# Patient Record
Sex: Male | Born: 1935 | Race: White | Hispanic: No | Marital: Married | Smoking: Former smoker
Health system: Southern US, Community
[De-identification: ages and names within clinical notes are randomized; demographics above are authoritative.]

## PROBLEM LIST (undated history)

## (undated) DIAGNOSIS — I739 Peripheral vascular disease, unspecified: Secondary | ICD-10-CM

## (undated) DIAGNOSIS — M1990 Unspecified osteoarthritis, unspecified site: Secondary | ICD-10-CM

## (undated) DIAGNOSIS — K5730 Diverticulosis of large intestine without perforation or abscess without bleeding: Secondary | ICD-10-CM

## (undated) DIAGNOSIS — R42 Dizziness and giddiness: Secondary | ICD-10-CM

## (undated) DIAGNOSIS — I10 Essential (primary) hypertension: Secondary | ICD-10-CM

## (undated) HISTORY — PX: APPENDECTOMY: SHX54

## (undated) HISTORY — DX: Dizziness and giddiness: R42

## (undated) HISTORY — DX: Essential (primary) hypertension: I10

## (undated) HISTORY — PX: OTHER SURGICAL HISTORY: SHX169

## (undated) HISTORY — DX: Peripheral vascular disease, unspecified: I73.9

## (undated) HISTORY — PX: TONSILLECTOMY: SUR1361

## (undated) HISTORY — DX: Diverticulosis of large intestine without perforation or abscess without bleeding: K57.30

---

## 1997-12-13 ENCOUNTER — Emergency Department (HOSPITAL_COMMUNITY): Admission: EM | Admit: 1997-12-13 | Discharge: 1997-12-13 | Payer: Self-pay | Admitting: Emergency Medicine

## 1998-03-02 ENCOUNTER — Inpatient Hospital Stay: Admission: RE | Admit: 1998-03-02 | Discharge: 1998-03-03 | Payer: Self-pay | Admitting: Thoracic Surgery

## 1998-03-04 ENCOUNTER — Emergency Department (HOSPITAL_COMMUNITY): Admission: EM | Admit: 1998-03-04 | Discharge: 1998-03-04 | Payer: Self-pay | Admitting: Emergency Medicine

## 2000-09-22 ENCOUNTER — Encounter: Admission: RE | Admit: 2000-09-22 | Discharge: 2000-09-22 | Payer: Self-pay | Admitting: Orthopedic Surgery

## 2000-09-22 ENCOUNTER — Encounter: Payer: Self-pay | Admitting: Orthopedic Surgery

## 2000-09-24 ENCOUNTER — Ambulatory Visit (HOSPITAL_BASED_OUTPATIENT_CLINIC_OR_DEPARTMENT_OTHER): Admission: RE | Admit: 2000-09-24 | Discharge: 2000-09-25 | Payer: Self-pay | Admitting: Orthopedic Surgery

## 2000-10-21 ENCOUNTER — Encounter: Admission: RE | Admit: 2000-10-21 | Discharge: 2000-12-08 | Payer: Self-pay | Admitting: Orthopedic Surgery

## 2000-12-10 ENCOUNTER — Encounter: Payer: Self-pay | Admitting: *Deleted

## 2000-12-10 ENCOUNTER — Ambulatory Visit (HOSPITAL_COMMUNITY): Admission: RE | Admit: 2000-12-10 | Discharge: 2000-12-10 | Payer: Self-pay | Admitting: *Deleted

## 2003-08-20 HISTORY — PX: OTHER SURGICAL HISTORY: SHX169

## 2005-02-27 ENCOUNTER — Ambulatory Visit (HOSPITAL_COMMUNITY): Admission: RE | Admit: 2005-02-27 | Discharge: 2005-03-06 | Payer: Self-pay | Admitting: Surgery

## 2005-02-27 ENCOUNTER — Encounter: Admission: RE | Admit: 2005-02-27 | Discharge: 2005-02-27 | Payer: Self-pay | Admitting: Surgery

## 2005-09-04 ENCOUNTER — Ambulatory Visit: Payer: Self-pay | Admitting: Gastroenterology

## 2005-09-18 ENCOUNTER — Ambulatory Visit: Payer: Self-pay | Admitting: Gastroenterology

## 2005-09-18 ENCOUNTER — Encounter (INDEPENDENT_AMBULATORY_CARE_PROVIDER_SITE_OTHER): Payer: Self-pay | Admitting: *Deleted

## 2008-08-24 ENCOUNTER — Ambulatory Visit: Payer: Self-pay | Admitting: Cardiology

## 2008-09-09 ENCOUNTER — Ambulatory Visit: Payer: Self-pay

## 2008-09-28 ENCOUNTER — Ambulatory Visit: Payer: Self-pay | Admitting: Cardiology

## 2009-08-17 ENCOUNTER — Encounter (INDEPENDENT_AMBULATORY_CARE_PROVIDER_SITE_OTHER): Payer: Self-pay | Admitting: *Deleted

## 2010-06-22 ENCOUNTER — Encounter: Payer: Self-pay | Admitting: Cardiovascular Disease

## 2010-06-22 ENCOUNTER — Ambulatory Visit: Payer: Self-pay

## 2010-06-22 DIAGNOSIS — I7025 Atherosclerosis of native arteries of other extremities with ulceration: Secondary | ICD-10-CM | POA: Insufficient documentation

## 2010-06-22 DIAGNOSIS — I739 Peripheral vascular disease, unspecified: Secondary | ICD-10-CM

## 2010-06-22 DIAGNOSIS — L98499 Non-pressure chronic ulcer of skin of other sites with unspecified severity: Secondary | ICD-10-CM

## 2010-07-02 ENCOUNTER — Telehealth (INDEPENDENT_AMBULATORY_CARE_PROVIDER_SITE_OTHER): Payer: Self-pay | Admitting: *Deleted

## 2010-07-03 DIAGNOSIS — R42 Dizziness and giddiness: Secondary | ICD-10-CM

## 2010-07-03 DIAGNOSIS — J45909 Unspecified asthma, uncomplicated: Secondary | ICD-10-CM | POA: Insufficient documentation

## 2010-07-03 DIAGNOSIS — E7800 Pure hypercholesterolemia, unspecified: Secondary | ICD-10-CM

## 2010-07-03 DIAGNOSIS — Z8719 Personal history of other diseases of the digestive system: Secondary | ICD-10-CM

## 2010-07-03 DIAGNOSIS — I10 Essential (primary) hypertension: Secondary | ICD-10-CM | POA: Insufficient documentation

## 2010-07-03 DIAGNOSIS — J449 Chronic obstructive pulmonary disease, unspecified: Secondary | ICD-10-CM | POA: Insufficient documentation

## 2010-07-03 DIAGNOSIS — R0789 Other chest pain: Secondary | ICD-10-CM | POA: Insufficient documentation

## 2010-07-04 ENCOUNTER — Ambulatory Visit: Payer: Self-pay | Admitting: Cardiovascular Disease

## 2010-07-04 ENCOUNTER — Encounter: Payer: Self-pay | Admitting: Cardiovascular Disease

## 2010-07-04 DIAGNOSIS — I739 Peripheral vascular disease, unspecified: Secondary | ICD-10-CM

## 2010-07-24 ENCOUNTER — Ambulatory Visit: Payer: Self-pay | Admitting: Cardiovascular Disease

## 2010-07-24 LAB — CONVERTED CEMR LAB
BUN: 15 mg/dL (ref 6–23)
Basophils Absolute: 0 10*3/uL (ref 0.0–0.1)
CO2: 30 meq/L (ref 19–32)
Eosinophils Absolute: 0.2 10*3/uL (ref 0.0–0.7)
GFR calc non Af Amer: 66.39 mL/min (ref >60.00)
Glucose, Bld: 101 mg/dL — ABNORMAL HIGH (ref 70–99)
HCT: 48.4 % (ref 39.0–52.0)
Lymphs Abs: 2.1 10*3/uL (ref 0.7–4.0)
MCHC: 34.8 g/dL (ref 30.0–36.0)
MCV: 93.8 fL (ref 78.0–100.0)
Monocytes Absolute: 0.4 10*3/uL (ref 0.1–1.0)
Monocytes Relative: 7.6 % (ref 3.0–12.0)
Neutro Abs: 2.9 10*3/uL (ref 1.4–7.7)
Platelets: 143 10*3/uL — ABNORMAL LOW (ref 150.0–400.0)
Potassium: 4.4 meq/L (ref 3.5–5.1)
Prothrombin Time: 10.3 s (ref 9.7–11.8)
RDW: 13.3 % (ref 11.5–14.6)

## 2010-07-27 ENCOUNTER — Ambulatory Visit (HOSPITAL_COMMUNITY)
Admission: RE | Admit: 2010-07-27 | Discharge: 2010-07-27 | Payer: Self-pay | Source: Home / Self Care | Attending: Cardiovascular Disease | Admitting: Cardiovascular Disease

## 2010-08-07 ENCOUNTER — Ambulatory Visit: Payer: Self-pay | Admitting: Cardiovascular Disease

## 2010-08-30 ENCOUNTER — Telehealth (INDEPENDENT_AMBULATORY_CARE_PROVIDER_SITE_OTHER): Payer: Self-pay

## 2010-09-03 ENCOUNTER — Encounter: Payer: Self-pay | Admitting: Cardiovascular Disease

## 2010-09-05 ENCOUNTER — Inpatient Hospital Stay (HOSPITAL_COMMUNITY)
Admission: AD | Admit: 2010-09-05 | Discharge: 2010-09-11 | Payer: Self-pay | Source: Home / Self Care | Attending: Cardiovascular Disease | Admitting: Cardiovascular Disease

## 2010-09-05 ENCOUNTER — Telehealth: Payer: Self-pay | Admitting: Cardiovascular Disease

## 2010-09-07 NOTE — H&P (Signed)
[name] [name], [name] [name]             ACCOUNT NO.:  [id number]  MEDICAL RECORD NO.:  [id number]          PATIENT TYPE:  INP  LOCATION:  2014                         FACILITY:  [facility]  PHYSICIAN:  Noralyn Pick. Eden Emms, MD, FACCDATE OF BIRTH:  11-17-1923  DATE OF ADMISSION:  09/05/2010 DATE OF DISCHARGE:                             HISTORY & PHYSICAL   PRIMARY CARE PROVIDER:  Gaspar Garbe, MD  PRIMARY CARDIOLOGIST:  [name], MD  PODIATRIST:  Eugenio Hoes. Petrinitz, DPM  HISTORY OF PRESENT ILLNESS:  This is an 77 year old gentleman with history of PVD, prior carotid endarterectomy, and hypertension who was followed by Dr. Wynelle Cleveland for an ulcer on his right great that is slow to heal.  The patient sees Dr. Clifton James for history of peripheral vascular disease.  He had Doppler studies in November 2011 with suggestion of bilateral lower extremity disease.  His ABIs are mildly reduced in both legs.  There is at least 50% stenosis in the right mid SFA.  The patient has no pain at rest but occasional calf pain with ambulation.  The patient's ulcer on the right great toe began early November.  After initial evaluation, a distal arteriogram of the lower extremity runoff was scheduled.  This showed severe PAD in the right lower extremity with severe disease in the right SFA in the mid and distal segment with one vessel runoff to the right foot via the PTA. However, PTA was severely diseased in the mid segment with total obstruction and an extensive collateral network with reconstitution distally.  Vascular surgery reviewed the case with Dr. Clifton James and there was no good percutaneous options for revascularization. Therefore, conservative management of the ulceration was decided upon. Initially the ulceration was slowly healing despite the poor arterial flow.  The patient was started on Augmentin and Cipro orally as an outpatient.  His infection appears to be spreading as his toe has  gotten red and edematous.  The patient called Dr. Gibson Ramp office and things getting worse and risk of tissue loss possibly requiring amputation. The patient was admitted for IV antibiotics.  The patient does complain of mild tenderness around the right toe.  He denies any fevers/chills, nausea/vomiting, or chest pain or shortness of breath.  The patient is currently hemodynamically stable.  PAST MEDICAL HISTORY: 1. Hypertension. 2. Dizziness. 3. Diverticulosis. 4. Asthma. 5. Peripheral artery disease. 6. Status post right carotid endarterectomy in 2005. 7. Status post repair of Achilles tendon rupture. 8. Status post appendectomy. 9. Status post tonsillectomy.  SOCIAL HISTORY:  The patient is married and lives with his wife.  He is refired from the banking business.  He has a remote history of tobacco abuse but quit approximately 30 years ago.  He denies any alcohol or illicit drug use.  FAMILY HISTORY:  Noncontributory for early coronary artery disease.  His mother and father are both deceased, age 32 and 68 respectively from unknown causes.  He has a brother who is deceased from liver disease.  ALLERGIES:  No known drug allergies.  HOME MEDICATIONS: 1. Celebrex 100 mg 1 capsule daily. 2. Spiriva Handihaler 18 mcg 1 puff once daily. 3.  Alprazolam 0.5 mg tablets 1 tablet at bedtime. 4. ProAir HFA 108 mcg inhaled as needed for shortness of breath. 5. Fluticasone nasally as needed. 6. Silver sulfadiazine 1% cream apply to affected area 2 times a day. 7. Ciprofloxacin 500 mg 1 tablet 2 times a day. 8. Augmentin 875 mg 1 tablet 2 times a day. 9. Multivitamin daily. 10.PreserVision capsules, 2 capsules daily. 11.Aspirin 81 mg daily.  REVIEW OF SYSTEMS:  All pertinent positives and negatives as stated in HPI.  Other systems have been reviewed and are negative.  PHYSICAL EXAMINATION:  VITAL SIGNS:  Temperature 97.1, pulse 91, respiration 18, blood pressure 158/77, O2  saturation 97% on room air. GENERAL:  This is a well-developed, well-nourished elderly gentleman in no acute distress. HEENT:  Normal. NECK:  Supple without bruit or JVD. HEART:  Regular rate and rhythm without murmur, gallops, or rubs. Pulses are diminished bilaterally. LUNGS:  Clear to auscultation bilaterally without wheezes, rales, or rhonchi. ABDOMEN:  Soft, nontender, positive bowel sounds x4. EXTREMITIES:  No clubbing or cyanosis.  Right great toe with ulceration over the tip.  There is surrounding erythema.  Area is warm to touch. Erythema does extend almost to the heel area. MUSCULOSKELETAL:  No joint deformities or effusions. NEURO:  Alert and oriented x3, cranial nerves II through XII grossly intact.  Labs pending.  ASSESSMENT/PLAN:  This is an 77 year old gentleman with peripheral vascular disease with lower extremity runoff with partial occlusion with collaterals that are not suitable for bypass.  The patient is now with  increased cellulitis around the right toe and forefoot.  The patient has been admitted to telemetry and an ID consult has been obtained for IV antibiotics initiation.  We will leave consideration for bone scan to ID as well.  Dr. Clifton James will evaluate the patient in the morning and further recommendations for addition of Pletal will be decided.  The patient will be continued on aspirin.  VTE prophylaxis will be with Lovenox.     Leonette Monarch, PA-C   ______________________________ Noralyn Pick Eden Emms, MD, [facility]    NB/MEDQ  D:  09/05/2010  T:  09/06/2010  Job:  [id number]  cc:   Gaspar Garbe, M.D. [name], MD Eugenio Hoes. Petrinitz, D.P.M.  Electronically Signed by Alen Blew P.A. on 09/06/2010 06:35:45 PM Electronically Signed by Charlton Haws MD [facility] on 09/07/2010 06:09:06 PM

## 2010-09-10 LAB — BASIC METABOLIC PANEL
BUN: 11 mg/dL (ref 6–23)
BUN: 13 mg/dL (ref 6–23)
CO2: 28 mEq/L (ref 19–32)
CO2: 28 mEq/L (ref 19–32)
Calcium: 9 mg/dL (ref 8.4–10.5)
Calcium: 8.4 mg/dL (ref 8.4–10.5)
Chloride: 97 mEq/L (ref 96–112)
Chloride: 98 mEq/L (ref 96–112)
Creatinine, Ser: 1.22 mg/dL (ref 0.4–1.5)
Creatinine, Ser: 1.19 mg/dL (ref 0.4–1.5)
Creatinine, Ser: 1.34 mg/dL (ref 0.4–1.5)
GFR calc Af Amer: >60 mL/min (ref >60)
GFR calc Af Amer: >60 mL/min (ref >60)
GFR calc non Af Amer: 57 mL/min — ABNORMAL LOW (ref >60)
GFR calc non Af Amer: 58 mL/min — ABNORMAL LOW (ref >60)
GFR calc non Af Amer: 51 mL/min — ABNORMAL LOW (ref >60)
Glucose, Bld: 153 mg/dL — ABNORMAL HIGH (ref 70–99)
Glucose, Bld: 101 mg/dL — ABNORMAL HIGH (ref 70–99)
Potassium: 4.3 mEq/L (ref 3.5–5.1)
Potassium: 4.2 mEq/L (ref 3.5–5.1)
Sodium: 135 mEq/L (ref 135–145)
Sodium: 134 mEq/L — ABNORMAL LOW (ref 135–145)

## 2010-09-10 LAB — WOUND CULTURE
Culture: NO GROWTH
Gram Stain: NONE SEEN

## 2010-09-10 LAB — CBC
HCT: 48 % (ref 39.0–52.0)
HCT: 43.8 % (ref 39.0–52.0)
Hemoglobin: 16.1 g/dL (ref 13.0–17.0)
Hemoglobin: 14.7 g/dL (ref 13.0–17.0)
MCH: 30.7 pg (ref 26.0–34.0)
MCH: 30.4 pg (ref 26.0–34.0)
MCHC: 33.5 g/dL (ref 30.0–36.0)
MCHC: 33.6 g/dL (ref 30.0–36.0)
MCV: 91.6 fL (ref 78.0–100.0)
Platelets: 191 10*3/uL (ref 150–400)
Platelets: 182 10*3/uL (ref 150–400)
RBC: 5.24 MIL/uL (ref 4.22–5.81)
RBC: 5.15 MIL/uL (ref 4.22–5.81)
RDW: 12.9 % (ref 11.5–15.5)
RDW: 12.5 % (ref 11.5–15.5)
RDW: 12.9 % (ref 11.5–15.5)
WBC: 5.7 10*3/uL (ref 4.0–10.5)
WBC: 5 10*3/uL (ref 4.0–10.5)

## 2010-09-11 DIAGNOSIS — L03119 Cellulitis of unspecified part of limb: Secondary | ICD-10-CM

## 2010-09-11 DIAGNOSIS — L02419 Cutaneous abscess of limb, unspecified: Secondary | ICD-10-CM | POA: Insufficient documentation

## 2010-09-11 LAB — PROTIME-INR
INR: 1.02 (ref 0.00–1.49)
Prothrombin Time: 13.6 seconds (ref 11.6–15.2)

## 2010-09-11 LAB — CBC
Hemoglobin: 14.3 g/dL (ref 13.0–17.0)
MCH: 31 pg (ref 26.0–34.0)
MCHC: 34.4 g/dL (ref 30.0–36.0)
Platelets: 179 10*3/uL (ref 150–400)
RDW: 12.8 % (ref 11.5–15.5)

## 2010-09-11 LAB — BASIC METABOLIC PANEL
Calcium: 8.2 mg/dL — ABNORMAL LOW (ref 8.4–10.5)
Calcium: 8.2 mg/dL — ABNORMAL LOW (ref 8.4–10.5)
Creatinine, Ser: 1.42 mg/dL (ref 0.4–1.5)
GFR calc Af Amer: 57 mL/min — ABNORMAL LOW (ref >60)
GFR calc Af Amer: >60 mL/min (ref >60)
GFR calc non Af Amer: 48 mL/min — ABNORMAL LOW (ref >60)
GFR calc non Af Amer: 57 mL/min — ABNORMAL LOW (ref >60)
Glucose, Bld: 93 mg/dL (ref 70–99)
Potassium: 3.9 mEq/L (ref 3.5–5.1)
Sodium: 135 mEq/L (ref 135–145)
Sodium: 134 mEq/L — ABNORMAL LOW (ref 135–145)

## 2010-09-11 LAB — VANCOMYCIN, TROUGH: Vancomycin Tr: 11.7 ug/mL (ref 10.0–20.0)

## 2010-09-12 LAB — CBC
HCT: 40.8 % (ref 39.0–52.0)
MCHC: 34.1 g/dL (ref 30.0–36.0)
Platelets: 191 10*3/uL (ref 150–400)
RDW: 13 % (ref 11.5–15.5)
WBC: 6.6 10*3/uL (ref 4.0–10.5)

## 2010-09-12 LAB — BASIC METABOLIC PANEL
BUN: 20 mg/dL (ref 6–23)
CO2: 24 mEq/L (ref 19–32)
Chloride: 105 mEq/L (ref 96–112)
Creatinine, Ser: 1.39 mg/dL (ref 0.4–1.5)
GFR calc Af Amer: 59 mL/min — ABNORMAL LOW (ref >60)
Potassium: 3.8 mEq/L (ref 3.5–5.1)

## 2010-09-16 NOTE — Consult Note (Addendum)
[name] [name], [name] [name]             ACCOUNT NO.:  [id number]  MEDICAL RECORD NO.:  [id number]           PATIENT TYPE:  LOCATION:                                 FACILITY:  PHYSICIAN:  Fransisco Hertz, MD       DATE OF BIRTH:  23-Jan-1924  DATE OF CONSULTATION:  09/06/2010 DATE OF DISCHARGE:                                CONSULTATION   REQUESTING PHYSICIAN:  [name] Carrow, MD  REASON FOR CONSULTATION:  Right foot gangrene.  HISTORY OF PRESENT ILLNESS:  This is an 77 year old gentleman who presents with chief complaint of swelling and pain in his right foot. In reviewing the chart, in November 2011, the patient started having problems with his right great toe, apparently after having seen a podiatrist and diagnosed him with an ulcer on his great toe.  He was then seen by his interventional cardiologist who had concerns that he had significant amount of peripheral arterial disease, and the patient underwent a diagnostic angiogram in December 2011 which was significant for multilevel disease and extensive calcification, demonstrating multilevel disease and extensive calcification extending from the aorta all the way down to his tibials.  He at that point did not have in-line flow to his tibials.  He was managed at this point conservatively per the patient's preferences.  The patient recently started noticing increased swelling in his right foot with increased pain and was subsequently admitted for inpatient IV antibiotics.  At this point, the patient notes with medication, including IV antibiotics, that his pain in his right great toe has completely resolved.  The patient denies any fevers or chills at home and noticed minimal drainage from this great toe.  Other than this one episode of development of right great toe ulceration, he denies any other wounds in his feet.  PAST MEDICAL HISTORY: 1. Hypertension. 2. Dizziness. 3. Diverticulosis. 4. Asthma. 5. Peripheral arterial  disease. 6. History of right internal carotid artery stenosis. 7. History of Achilles tendon rupture. 8. History of chronic obstructive pulmonary disease. 9. Hyperlipidemia.  PAST SURGICAL HISTORY: 1. Right carotid endarterectomy, done by Dr. Edwyna Shell in 2005. 2. He also underwent a repair of his right Achilles tendon rupture. 3. He has had an appendectomy and tonsillectomy and adenoidectomy done     previously.  SOCIAL HISTORY:  He is retired.  Previous history of tobacco smoking but quit over 25 years ago.  Denies any current alcohol or illicit drug use.  FAMILY HISTORY:  Mother and father died of old age of unknown causes.  MEDICATIONS:  He is on Celebrex, Spiriva, Xanax, ProAir inhaler, Flonase, Silvadene, Cipro, Augmenting, PreserVision capsules, and aspirin.  He has no known drug allergies.  REVIEW OF SYSTEMS:  As listed above, otherwise, noted to be negative on questioning.  PHYSICAL EXAMINATION:  VITAL SIGNS:  He had a temperature of 97.8 with a blood pressure of 163/89, heart rate of 80, respirations were 18.  He sats at 97% on room air. GENERAL:  Well developed, well nourished, in no apparent distress, alert and oriented x3. HEENT:  Head:  Normocephalic, atraumatic.  ENT:  Hearing was grossly intact.  Oropharynx  without any obvious erythema or exudate.  Nares without any drainage or erythema.  Eyes:  Pupils were equal, round, reactive to light.  Extraocular movements were intact. NECK:  He had no nuchal rigidity.  He had a supple neck.  There was no JVD. PULMONARY:  He has symmetric expansion.  Good air movement.  There were no rales, rhonchi, or wheezing. CARDIAC:  Regular rate and rhythm.  Normal S1 and S2.  No murmurs, rubs, thrills, or gallops. VASCULAR:  He has palpable radials, brachials, and carotids bilaterally. There is a healed right neck incision consistent with a carotid endarterectomy.  There are no bruits in either neck.  I did not appreciate aortic  pulse due to some mild obesity.  He has palpable femoral pulses; however, I do not appreciate any popliteal or pedal pulses. GASTROINTESTINAL:  He had a soft abdomen, nontender, nondistended.  No guarding, no rebound, no hepatosplenomegaly.  There were no obvious masses. MUSCULOSKELETAL:  He had 5/5 strength in upper extremities and the left leg.  I was not able to fully test his strength in his right foot due to the foot ulcer; however, he was able to extend and flex his knee with 5/5 strength, and was able to fully plantar flex and dorsal flex. EXTREMITIES:  On examination of the right foot, the foot has 1+ edema. The toe now demonstrates dry gangrene on the plantar surface of the toe. There is no frank drainage.  At this point, the toe is significantly swollen, some mild erythema extending up the dorsum of this foot, does not extend past the level of the ankle, some dependent rubor which improved with elevating at this foot.  On the left foot, there are no signs of gangrene or ulceration. NEUROLOGIC:  His cranial nerves II-XII were intact.  His motor was as listed above.  His sensation was intact including the right foot where he had intact light touch to evaluation. PSYCH:  His judgment was intact.  His mood and affect were appropriate for his clinical situation. SKIN:  He had some dysplastic lesions on his face and arms; however, I did not note any rashes elsewhere in his body.  The extremities were as listed above. LYMPHATIC:  No cervical, axillary, inguinal lymphadenopathy were noted.  LABORATORY STUDIES:  Initial wound cultures did not grow any organisms and no white cells were seen.  He had a chemistry, sodium 138, potassium 4.2, chloride of 98, bicarb 27, glucose 101, BUN was 13, creatinine is 1.2.  CBC; white count was 5.0 with H and H of 15.8 and 45.5, platelet count was 182.  I also reviewed Dr. Gibson Ramp previous aortogram and right leg runoff, and I agree with his  interpretation of the films, there is extensive amount of atherosclerotic disease throughout this patient with heavy calcification in some segments of the arteries.  Just looking at the inflow on this side, there is some atherosclerotic disease in the iliac system.  Distally, it appears that the dominant runoff in the foot is a posterior tibial which reconstitutes via collaterals.  This artery distally looks to be about 2 mm to 2.5 mm.  There is also anterior tibial which reconstitutes on the dorsum of the foot and extends down into the foot as the DP, but it seems to attenuate, likely due to decreased flow.  MEDICAL DECISION MAKING:  This is an 77 year old gentleman with what sounds like significant peripheral arterial disease that was managed initially medically that now has developed infectious complications of  his previous toe ulcer.  After reviewing the films, I am quite concerned with the quality of the inflow and the size of the target vessels as the dorsalis pedis vessel I think is less than 2 mm and then the posterior tibial target would be anywhere from 2-2.5 mm in diameter, but unfortunately, I do not have this image been archived at this point, hence it is not amenable to digital caliper measurements.  I wouldrecommend this patient proceeding with bilateral lower extremity vein mapping to see if there is a possible conduit, also preop from a medical optimization viewpoint and proceed with risk stratification.  Given the presence of atherosclerotic disease in his carotids and his legs, there is almost certainly some degree of coronary artery disease.  Additionally, the patient on his previous chest x-rays has demonstrated findings consistent with COPD.  Unfortunately, I suspect in this case the risks versus long-term patency in this case may weight against aggressive intervention as he minimally is going to need like a common femoral artery to likely posterior tibial bypass.   The patencies of such are very poor with prosthetic grafts, hence the importance of the vein mapping.  However, the other concern with this is the quality inflow. There is a significant amount of atherosclerotic disease within the iliac and the aorta itself.  This ultimately will translate into compromised long-term patencies for any type of distal bypass.  I also discussed with the patient possible need for amputation, especially if there is any evidence of osteomyelitis.  He at this point has emphatically said he does not want any form of amputation.  At this point, I think that he would undergo all the diagnostic testing and bring him back to the peripheral lab and redo a right leg angiogram specifically looking at potential targets for bypass and the quality of the inflow to better assess the burden of calcium disease in his arteries.  Based on this data along with his overall status in terms of his heart and other medical issues, I think we would be able to get a better feel for whether or not the risks outweigh the benefits in this case in this patient.  I discussed this strategy with the patient, at this point he agrees to proceed forward with such.  Thank you for giving [place] the opportunity to participate in this patient's care, and we will arrange for this angiogram for Monday.     Fransisco Hertz, MD BLC/MEDQ  D:  09/06/2010  T:  09/07/2010  Job:  [id number]  Electronically Signed by Leonides Sake MD on 09/16/2010 02:45:33 PM

## 2010-09-16 NOTE — Op Note (Addendum)
NAMEWYNNE, JURY             ACCOUNT NO.:  [id number]  MEDICAL RECORD NO.:  [id number]          PATIENT TYPE:  INP  LOCATION:  2014                         FACILITY:  [facility]  PHYSICIAN:  Fransisco Hertz, MD       DATE OF BIRTH:  03-06-1924  DATE OF PROCEDURE:  09/10/2010 DATE OF DISCHARGE:                              OPERATIVE REPORT   PROCEDURE: 1. Left common femoral artery cannulation with ultrasound guidance. 2. Third order arterial selection. 3. Right leg angiogram.  PREOPERATIVE DIAGNOSES:  Right foot ischemia and cellulitis.  POSTOPERATIVE DIAGNOSES:  Right foot ischemia and cellulitis.  SURGEON:  Arlys John L. Imogene Burn, MD  ANESTHESIA:  Conscious sedation.  CONTRAST:  60 mL.  FINDINGS: 1. Patent right common femoral artery. 2. Diseased right superficial femoral artery with multiple stenoses,     at least two greater 50% with extensive amount of     calcification throughout this artery. 3. Patent right profunda artery. 4. Patent right popliteal artery with above-the-knee stenosis, greater     than 50%. 5. The right anterior tibial artery occludes shortly after takedown. 6. The right tibioperoneal trunk is patent. 7. The right peroneal artery occludes about halfway down the lower     leg. 8. The right posterior tibial artery occludes distal to the ankle     joint. 9. Right foot fills via collaterals from reconstituted peroneal and     posterior tibial arteries. 10.The distal reconstituted posterior tibial is only about 2 mm at the     level below the ankle. 11.Stenoses and calcifications are evident throughout the plantar     arch. 12.There is evidence of pedal filling despite the extent of disease     noted above.  INDICATIONS:  This is an 77 year old gentleman with known peripheral arterial disease.  Recently, he developed some possible cellulitis in his right foot and possible ischemic changes to his right great toe. Vascular Surgery consultation was obtained  to try to see if he would be a bypass candidate.  I reviewed, Dr. Gibson Ramp previous angiogram done on this leg and is evident this patient has extensive atherosclerotic disease throughout his arterial system.  I could not fully appreciate the extent of calcification and the caliber of the distal targets in this patient on this previous angiogram, so I could not make a definite decision whether or not he would be a candidate for a bypass procedure. Consequently, I discussed with the patient the need to repeat the right leg angiogram.  He agreed to proceed for he is aware of the risk of this procedure, which includes access complication, bleeding, infection, possible embolization, possible dissection, and need for emergent surgical intervention.  He agreed to proceed forward with the operation with an understanding of these risks.  DESCRIPTION OF OPERATION:  After full informed written consent was obtained from the patient, he was brought back to the angio suite, and  placed supine upon the angio table.  He was connectec to monitoring equipment and was given conscious sedation, the amounts of which are documented in his chart.  I turned my attention to his left groin.  Under ultrasound  guidance, I identified the left common femoral artery and cannulated it with an 18-gauge needle and passed the Bentson wire up into the aorta. The needle was then exchanged for 5-French sheath.  A dilator was removed and then the Omni flush catheter was loaded over the wire.  Using the wire and Omni Flush catheter, I was able to select out the right common iliac artery, and I was able to advance the wire into the external iliac artery; however, it would not pass the catheter.  The catheter was exchanged for a 4-French end-hole catheter, which was loaded over the wire and advanced down into the external iliac artery.  I then was able to navigate into the superficial femoral artery and then lodged the  end- hole catheter down into the superficial femoral artery.  This was then connected to a power injector circuit.  After completing a de-airing and declotting maneuver, an automated right leg runoff was then completed, the findings of which are noted as above.  Then, I positioned the patient's right foot in the lateral position and completed a single power injector angiogram to evaluate the right foot.  Based on these findings, I do not think this patient is a candidate for a surgical bypass as the distal pedal artery arch already demonstrates disease, and any bypass to this 2 mm of vessel would already be of limited patency given the small size of the target vessel, but also the addition of outflow disease is unlikely to have any reasonable patency and subsequently does not justify the cardiac risk in this patient.  He does, however, have filling of his pedal arch despite the extent of his disease and if this patient is not able to fully perfuse and heal his right foot by itself, he would be a candidate in the future for orbital atherectomy and possible angioplasty of the SFA above-knee popliteal and possible peroneal artery. COMPLICATIONS:  None.  CONDITION:  Stable.     Fransisco Hertz, MD     BLC/MEDQ  D:  09/10/2010  T:  09/11/2010  Job:  [id number]  Electronically Signed by Leonides Sake MD on 09/16/2010 02:49:25 PM

## 2010-09-17 NOTE — Discharge Summary (Signed)
NAMEJOVIAN, [name] [name]             ACCOUNT NO.:  [id number]  MEDICAL RECORD NO.:  [id number]          PATIENT TYPE:  INP  LOCATION:  2014                         FACILITY:  [facility]  PHYSICIAN:  [name], MDDATE OF BIRTH:  April 14, 1924  DATE OF ADMISSION:  09/05/2010 DATE OF DISCHARGE:  09/11/2010                              DISCHARGE SUMMARY   PRIMARY CARDIOLOGIST:  [name], MD  PRIMARY CARE PHYSICIAN:  Gaspar Garbe, MD  DISCHARGE DIAGNOSIS:  Lower extremity peripheral vascular disease.  SECONDARY DIAGNOSES: 1. Hypertension. 2. Chronic dizziness. 3. Diverticulosis. 4. Asthma. 5. Cellulitis with ongoing antibiotic therapy. 6. Status post right carotid endarterectomy in 2005. 7. Status post Achilles tendon rupture. 8. Status post appendectomy. 9. Status post tonsillectomy. 10.Tobacco abuse.  ALLERGIES:  No known drug allergies.  PROCEDURES:  Right lower extremity angiogram revealing multiple greater than 50% stenoses involving the right superficial femoral artery, right popliteal artery, right anterior tibial, right peroneal, right posterior tibial.  The right foot fills via collaterals from reconstituted peroneal and posterior tibial.  Anatomy not suitable for lower extremity bypass.  HISTORY OF PRESENT ILLNESS:  An 77 year old male with history of peripheral vascular disease and right lower extremity cellulitis, poorly healing ulceration with concern for ischemia.  In the outpatient setting, the patient had been treated with Augmentin and Cipro, however, infection appeared to be worsening.  The patient called into our office on September 05, 2010, and decision was made to admit the patient for IV antibiotics.  The patient presented to the ED and was subsequently admitted.  HOSPITAL COURSE:  Throughout admission, the patient remained afebrile with normal white blood cell count.  Infectious Disease was consulted and intravenous vancomycin was  initiated.  Outpatient x-rays of his right foot were reviewed and there was question of fracture of the distal right first phalanx.  There was not obvious abscess and was not clear if the patient had osteomyelitis.  It was not felt the patient required biopsy or I and D at this time as those could potentially worsen the patient's condition given known peripheral vascular disease and arterial insufficiency.  The patient's foot improved some with IV vancomycin, continued antibiotics, and conservative management recommended along with vascular surgical evaluation.  The patient was seen by Dr. Jerre Simon and decision was made that the patient will require right lower extremity runoff to evaluate anatomy and consider bypass if possible.  Peripheral angiography was performed on September 10, 2010, revealing significant diffuse lower extremity disease as outlined above.  It was felt that the patient's anatomy was not compatible with bypass with orbital atherectomy of the superficial femoral artery, popliteal, and possibly peroneal plus/minus PTA may improve flow to the foot, but carried significant risk of embolization and dissection.  For the time being, continued medical therapy with antibiotics as warranted and the patient is healing over the course the next 2 weeks upon follow up with Dr. Johny Drilling, then atherectomy will be reconsidered.  In the meantime, the patient will be discharged home today in good condition on Keflex therapy at the recommendation of Infectious Disease.  DISCHARGE LABS:  Hemoglobin 13.9, hematocrit 40.8, WBC 6.6,  platelets 191.  Sodium 138, potassium 3.8, chloride 105, CO2 24, BUN 20, creatinine 1.39, glucose 92, calcium 8.5.  Wound culture showed no growth.  DISPOSITION:  The patient will discharged home today in good condition.  FOLLOWUP PLANS AND APPOINTMENTS:  The patient will follow up with Dr. Imogene Burn in approximately 2 weeks, appointment is pending.  Follow up  Dr. Clifton James on October 29, 2010, at 9 a.m.  Follow up with Dr. Wylene Simmer as scheduled.  DISCHARGE MEDICATIONS: 1. Norvasc 10 mg daily. 2. Pletal 50 mg b.i.d. 3. Keflex 500 mg q.i.d. x10 days. 4. Aspirin 81 mg daily. 5. Celebrex 200 mg daily. 6. Flonase 1 spray daily p.r.n. 7. Multivitamin 1 tab daily. 8. ProAir inhaler 1-2 puffs q.4-6 hours p.r.n. 9. PreserVision over the counter 1 tablet daily. 10.Spiriva 18 mcg daily p.r.n. 11.Xanax 0.5 mg 1/2 a tablet q.h.s. p.r.n.  OUTSTANDING LAB STUDIES:  None.  DURATION OF DISCHARGE ENCOUNTER:  60 minutes including physician time.     [name], ANP   ______________________________ [name], MD    CB/MEDQ  D:  09/11/2010  T:  09/12/2010  Job:  [id number]  cc:   Fransisco Hertz, MD Gaspar Garbe, M.D. Cliffton Asters, M.D.  Electronically Signed by [name] ANP on 09/17/2010 12:28:30 PM Electronically Signed by [name] MD on 09/17/2010 02:44:36 PM

## 2010-09-18 ENCOUNTER — Ambulatory Visit: Admit: 2010-09-18 | Payer: Self-pay | Admitting: Cardiovascular Disease

## 2010-09-20 NOTE — Letter (Signed)
Summary: Peripheral Vascular  [facility], Main Office  [address]   [place], [place] [address]   Phone: [phone]  Fax: [phone]     07/24/2010 MRN: [id number]  [facility] [address] [place], [place]  [address]  Dear Mr. KASPAR,   You are scheduled for Peripheral Vascular Angiogram on  07/27/10            with Dr.McAlhany.  Please arrive at the [facility] at 8:30     a.m. on the day of your procedure.  1. DIET     __x__ Nothing to eat or drink after midnight except your medications with a sip of water.  2. MAKE SURE YOU TAKE YOUR ASPIRIN.      __x__ YOU MAY TAKE ALL of your remaining medications with a small amount of water.   3. Plan for one night stay - bring personal belongings (i.e. toothpaste, toothbrush, etc.)  4. Bring a current list of your medications and current insurance cards.  5. Must have a responsible person to drive you home.   6. Someone must be with yu for the first 24 hours after you arrive home.  7. Please wear clothes that are easy to get on and off and wear slip-on shoes.  *Special note: Every effort is made to have your procedure done on time.  Occasionally there are emergencies that present themselves at the hospital that may cause delays.  Please be patient if a delay does occur.  If you have any questions after you get home, please call the office at the number listed above.   Whitney Maeola Sarah RN

## 2010-09-20 NOTE — Assessment & Plan Note (Signed)
Summary: eval for rt great toe ulcer dx 440.23/lower arterial doppler ...   Visit Type:  new PV pt visit Primary Provider:  [name]  CC:  pt here for ulcer on right great toe.....sob at times...pt states he has leg cramps ever since he started on the Amoxicillian for the toe.....[name]  History of Present Illness: 77 yo male with history of PVD, prior carotid endarterectomy, HTN and former tobacco abuse here today for PV evaluation.  He has an ulcer on his right great toe which has been folllowed by Dr. Leonia Reeves for the last two weeks. Doppler studies done on 06/22/10 with suggestion of bilateral lower ext disease. His ABI are mildly reduced in both legs. He describes occasional calf pain with ambulation that resolves with rest. No rest pain in the legs. He has no other complaints. He tells me that he feels well.   Current Medications (verified): 1)  Celebrex 200 Mg Caps (Celecoxib) .[name].. 1 Cap Once Daily 2)  Spiriva Handihaler 18 Mcg Caps (Tiotropium Bromide Monohydrate) .[name].. 1 Puff Once Daily 3)  Alprazolam 0.5 Mg Tabs (Alprazolam) .[name].. 1 Tab At Bedtime 4)  Proair Hfa 108 (90 Base) Mcg/act Aers (Albuterol Sulfate) .... Use As Directed As Needed 5)  Fluticasone Propionate 50 Mcg/act Susp (Fluticasone Propionate) .... As Needed 6)  Silver Sulfadiazine 1 % Crea (Silver Sulfadiazine) .... Apply To Affected Area Two Times A Day 7)  Ciprofloxacin-Ciproflox Hcl 500 Mg Xr24h-Tab (Ciprofloxacin-Ciproflox Hcl) .[name].. 1 Tab Two Times A Day 8)  Amoxicillin-Pot Clavulanate 875-125 Mg Tabs (Amoxicillin-Pot Clavulanate) .[name].. 1 Tab Two Times A Day 9)  Multivitamins   Tabs (Multiple Vitamin) .[name].. 1 Tab Once Daily 10)  Preservision Areds  Caps (Multiple Vitamins-Minerals) .... 2 Caps Once Daily 11)  Aspirin 81 Mg Tbec (Aspirin) .... Take One Tablet By Mouth Daily  Allergies (verified): No Known Drug Allergies  Past History:  Past Medical History: HYPERTENSION (ICD-401.9) DIZZINESS  (ICD-780.4) DIVERTICULOSIS, COLON, HX OF (ICD-V12.79) ASTHMA (ICD-493.90) PVD  Past Surgical History: Right carotid endarterectomy 2005 Exploration debridement, repair of Achilles tendon rupture, right. SURGEON:  Loreta Ave, M.D. Appendectomy Tonsillectomy  Family History: Reviewed history from 07/03/2010 and no changes required.  There is no strong family history of coronary disease   at a young age.  Mother-deceased, unknown reason, age 26.  Father-deceased, unknwon reason age 95 1 brother recently died from liver issues No sisters  Social History: Reviewed history from 07/03/2010 and no changes required.  The patient is married.   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   He did smoke in the past, but quit 30 years ago.  No alcohol No ilicit drug use  Review of Systems  The patient denies fatigue, malaise, fever, weight gain/loss, vision loss, decreased hearing, hoarseness, chest pain, palpitations, shortness of breath, prolonged cough, wheezing, sleep apnea, coughing up blood, abdominal pain, blood in stool, nausea, vomiting, diarrhea, heartburn, incontinence, blood in urine, muscle weakness, joint pain, leg swelling, rash, skin lesions, headache, fainting, dizziness, depression, anxiety, enlarged lymph nodes, easy bruising or bleeding, and environmental allergies.         Right great toe ulcer, occasional pain in calf muscles with walking.   Vital Signs:  Patient profile:   77 year old male Height:      71 inches Weight:      189 pounds BMI:     26.46 Pulse rate:  97 / minute Pulse rhythm:   irregular BP sitting:   152 / 70  (left arm) Cuff size:   regular  Vitals Entered By: Danielle Rankin, CMA (July 04, 2010 11:18 AM)  Physical Exam  General:  General: Well developed, well nourished, NAD HEENT: OP clear, mucus membranes moist SKIN: warm, dry Neuro: No focal  deficits Musculoskeletal: Muscle strength 5/5 all ext Psychiatric: Mood and affect normal Neck: No JVD, no carotid bruits, no thyromegaly, no lymphadenopathy. Lungs:Clear bilaterally, no wheezes, rhonci, crackles CV: RRR no murmurs, gallops rubs Abdomen: soft, NT, ND, BS present Extremities: No edema, pulses trace bilateral DP/PT. Healing ulceration over tip of right great toe. Minimal surrounding erythema.     EKG  Procedure date:  07/04/2010  Findings:      NSR, non-specific ST changes.   Arterial Doppler  Procedure date:  06/22/2010  Findings:      Heavily calcified bilateral SFAs.  Focal >50% proximal to mid SFA stenosis. Right ABI 0.74. TBI 0.51. Focal 0-49% stenosis  mid to distal left SFA. Left ABI 0.79, TBI 0.56. Bilateral TBI are in range for adequate  tissue healing.   Impression & Recommendations:  Problem # 1:  PVD (ICD-443.9) Mr. Bray has an ulcer on his right great toe with mild reduction of the bilateral ABI suggestive of at least mild to moderate obstructive lower extremity arterial disease. At this time, his toe wound appears to healing. I have offered him a distal aortogram with bilateral lower extremity runoff to better define the extent of his disease. He is not interested in pursuing an invasive workup at this time. I will see him back in the office in 2-3 weeks and have this discussion again at that time. If his toe wound is well healed, we may be able to pursue a conservative strategy of care. If the toe wound is not healing, we can consider the angiogram to look for percutaneous options.   Other Orders: EKG w/ Interpretation (93000)  Patient Instructions: 1)  Your physician recommends that you schedule a follow-up appointment in: 2-3 weeks. 2)  Your physician recommends that you continue on your current medications as directed. Please refer to the Current Medication list given to you today.

## 2010-09-20 NOTE — Assessment & Plan Note (Signed)
Summary: f77m/wpa   Visit Type:  Follow-up Primary Provider:  [name]  CC:  Right great toe ulcer.  History of Present Illness: 77 yo male with history of PVD, prior carotid endarterectomy, HTN and former tobacco abuse here today for PV follow up  He has an ulcer on his right great toe which has been folllowed by Dr. Leonia Reeves for the last month. Doppler studies done on 06/22/10 with suggestion of bilateral lower ext disease. His ABI are mildly reduced in both legs. There is at least a 50% stenosis in the right mid SFA. He describes occasional calf pain with ambulation that resolves with rest. No rest pain in the legs. He has no other complaints. I saw him as a new patient two weeks ago and we decided to wait and see if the ulcer healed as his ABI are only mildy reduced on the right. He is here today and the ulcer on the right great toe is not healing well. He has no other complaints. He is due to see Dr. Ann Lions tomorrow.   Problems Prior to Update: 1)  Pvd  (ICD-443.9) 2)  Chest Discomfort  (ICD-786.59) 3)  Hypercholesterolemia  (ICD-272.0) 4)  Hypertension  (ICD-401.9) 5)  Dizziness  (ICD-780.4) 6)  Diverticulosis, Colon, Hx of  (ICD-V12.79) 7)  Asthma  (ICD-493.90) 8)  COPD  (ICD-496) 9)  [place] Native Art Extremities W/ulceration  (ICD-440.23)  Current Medications (verified): 1)  Celebrex 200 Mg Caps (Celecoxib) .[name].. 1 Cap Once Daily 2)  Spiriva Handihaler 18 Mcg Caps (Tiotropium Bromide Monohydrate) .[name].. 1 Puff Once Daily 3)  Alprazolam 0.5 Mg Tabs (Alprazolam) .[name].. 1 Tab At Bedtime 4)  Proair Hfa 108 (90 Base) Mcg/act Aers (Albuterol Sulfate) .... Use As Directed As Needed 5)  Fluticasone Propionate 50 Mcg/act Susp (Fluticasone Propionate) .... As Needed 6)  Silver Sulfadiazine 1 % Crea (Silver Sulfadiazine) .... Apply To Affected Area Two Times A Day 7)  Multivitamins   Tabs (Multiple Vitamin) .[name].. 1 Tab Once Daily 8)  Preservision Areds  Caps (Multiple  Vitamins-Minerals) .... 2 Caps Once Daily 9)  Aspirin 81 Mg Tbec (Aspirin) .... Take One Tablet By Mouth Daily  Allergies: No Known Drug Allergies  Past History:  Past Medical History: Reviewed history from 07/04/2010 and no changes required. HYPERTENSION (ICD-401.9) DIZZINESS (ICD-780.4) DIVERTICULOSIS, COLON, HX OF (ICD-V12.79) ASTHMA (ICD-493.90) PVD  Past Surgical History: Right carotid endarterectomy 2005 Exploration debridement, repair of Achilles tendon rupture, right. SURGEON:  Loreta Ave, M.D. Appendectomy Tonsillectomy Perforated large intestines  Family History: Reviewed history from 07/04/2010 and no changes required.  There is no strong family history of coronary disease   at a young age.  Mother-deceased, unknown reason, age 61.  Father-deceased, unknwon reason age 77 1 brother recently died from liver issues No sisters  Social History: Reviewed history from 07/04/2010 and no changes required.  The patient is married.   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   He did smoke in the past, but quit 30 years ago.  No alcohol No ilicit drug use  Review of Systems  The patient denies fatigue, malaise, fever, weight gain/loss, vision loss, decreased hearing, hoarseness, chest pain, palpitations, shortness of breath, prolonged cough, wheezing, sleep apnea, coughing up blood, abdominal pain, blood in stool, nausea, vomiting, diarrhea, heartburn, incontinence, blood in urine, muscle weakness, joint pain, leg swelling, rash, skin lesions, headache, fainting, dizziness, depression, anxiety, enlarged  lymph nodes, easy bruising or bleeding, and environmental allergies.         right great toe ulceration  Vital Signs:  Patient profile:   77 year old male Height:      71 inches Weight:      187 pounds Pulse rate:   88 / minute Resp:     14 per minute BP sitting:   164 / 70  (left  arm)  Vitals Entered By: Danielle Rankin, CMA (July 24, 2010 2:34 PM)   Physical Exam  General:  General: Well developed, well nourished, NAD HEENT: OP clear, mucus membranes moist SKIN: warm, dry Neuro: No focal deficits Musculoskeletal: Muscle strength 5/5 all ext Psychiatric: Mood and affect normal Neck: No JVD, no carotid bruits, no thyromegaly, no lymphadenopathy. Lungs:Clear bilaterally, no wheezes, rhonci, crackles CV: RRR no murmurs, gallops rubs Abdomen: soft, NT, ND, BS present Extremities: No edema, pulses trace bilateral DP/PT. Healing ulceration over tip of right great toe. Minimal surrounding erythema.    Prior Report Reviewed for Arterial Doppler:  Findings: 06/22/2010 Heavily calcified bilateral SFAs.  Focal >50% proximal to mid SFA stenosis. Right ABI 0.74. TBI 0.51. Focal 0-49% stenosis  mid to distal left SFA. Left ABI 0.79, TBI 0.56. Bilateral TBI are in range for adequate  tissue healing.   Comments:    Impression & Recommendations:  Problem # 1:  PVD (ICD-443.9) Mr. Sarate has at least moderate peripheral arterial disease. He has an ulcer on his right great toe that is slowly healing but is sitll ulcerated. We have discussed angiography to define his PAD. He is in agreement today to proceed. Will plan for Friday December 9th at [facility]. Risks and benefits reviewed with the patient. Will check BMET, CBC and coags today. I am hopeful that he will have disease amenable to percutaneous intervention.   Other Orders: PV Procedure (PV Procedure) TLB-BMP (Basic Metabolic Panel-BMET) (80048-METABOL) TLB-CBC Platelet - w/Differential (85025-CBCD) TLB-PT (Protime) (85610-PTP)  Patient Instructions: 1)  Your physician recommends that you schedule a follow-up appointment in:  2)  Your physician recommends that you continue on your current medications as directed. Please refer to the Current Medication list given to you today. 3)  Your physician  has requested that you have a peripheral vascular angiogram. You are scheduled for 07/27/10 @ 10:30am. Please be there at 8:30am.  This exam is performed at the hospital. During this exam IV contrast is used to look at arterial blood flow.  Please review the information sheet given for details.

## 2010-09-20 NOTE — Progress Notes (Signed)
Summary: Schedule recall colon  Phone Note Outgoing Call Call back at [facility] Phone [phone]   Call placed by: Darcey Nora RN, CGRN,  August 30, 2010 4:11 PM Call placed to: Patient Summary of Call: I spoke with the patient's wife about scheduling  a recall colonoscopy.  The patient's wife does not think the patient is interested at all in scheduling a screening colon.  She is given the number to our office I have asked her to please call us or have him call us if he is interested in persuing.  He is asked to schedule an office visit to discuss if interested. Initial call taken by: Darcey Nora RN, CGRN,  August 30, 2010 4:12 PM  Follow-up for Phone Call        Please cancel colonoscopy recalls. Follow-up by: Meryl Dare MD FACG,  August 30, 2010 8:35 PM  Additional Follow-up for Phone Call Additional follow up Details #1::        recall canceled in [facility] Additional Follow-up by: Darcey Nora RN, CGRN,  August 31, 2010 7:28 AM

## 2010-09-20 NOTE — Assessment & Plan Note (Signed)
Summary: f17m/wpa   Visit Type:  Follow-up Primary [name] [name]:  [name] [name]  CC:  None.  History of Present Illness: 77 yo male with history of PVD, prior carotid endarterectomy, HTN and former tobacco abuse here today for PV follow up  He has an ulcer on his right great toe which has been folllowed by Dr. Leonia Reeves for the last month. Doppler studies done on 06/22/10 with suggestion of bilateral lower ext disease. His ABI are mildly reduced in both legs. There is at least a 50% stenosis in the right mid SFA. He describes occasional calf pain with ambulation that resolves with rest. No rest pain in the legs. He has no other complaints. I saw him as a new patient two weeks ago and we decided to wait and see if the ulcer healed as his ABI are only mildy reduced on the right. I arranged a distal aortogram with lower extremity runoff. This was performed on 12/0/11. He was found to have severe PAD in the right lower extremity with severe disease in the right SFA in the mid and distal segment with 1 vessel runoff to the right foot via the PTA  however the PTA was severely diseased in the mid segment with total obstruction and an extensive collateral network with reconstitution distally. I discussed his case with vascular surgery. There were no good percutaneous options for revascularization. We decided to manage conservatively and see if the ulceration on the great toe heals. I discussed the case with Dr. Wynelle Cleveland who has been doing a great job with local wound care. The toe ulceration is slowly healing despite the poor arterial flow. Future options could include popliteal to tibial bypass but this would only be considered if he develops ischemia of the foot or gangrene of the great toe.   He is feeling well. He has no new complaints.   Current Medications (verified): 1)  Celebrex 200 Mg Caps (Celecoxib) .[name].. 1 Cap Once Daily 2)  Spiriva Handihaler 18 Mcg Caps (Tiotropium Bromide Monohydrate) .[name].. 1  Puff Once Daily 3)  Alprazolam 0.5 Mg Tabs (Alprazolam) .[name].. 1 Tab At Bedtime 4)  Proair Hfa 108 (90 Base) Mcg/act Aers (Albuterol Sulfate) .... Use As Directed As Needed 5)  Fluticasone Propionate 50 Mcg/act Susp (Fluticasone Propionate) .... As Needed 6)  Silver Sulfadiazine 1 % Crea (Silver Sulfadiazine) .... Apply To Affected Area Two Times A Day 7)  Multivitamins   Tabs (Multiple Vitamin) .[name].. 1 Tab Once Daily 8)  Preservision Areds  Caps (Multiple Vitamins-Minerals) .... 2 Caps Once Daily 9)  Aspirin 81 Mg Tbec (Aspirin) .... Take One Tablet By Mouth Daily  Allergies (verified): No Known Drug Allergies  Past History:  Past Medical History: HYPERTENSION (ICD-401.9) DIZZINESS (ICD-780.4) DIVERTICULOSIS, COLON, HX OF (ICD-V12.79) ASTHMA (ICD-493.90) PAD-severe bilateral lower ext disease.  Social History: Reviewed history from 07/04/2010 and no changes required.  The patient is married.   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   He did smoke in the past, but quit 30 years ago.  No alcohol No ilicit drug use  Review of Systems  The patient denies fatigue, malaise, fever, weight gain/loss, vision loss, decreased hearing, hoarseness, chest pain, palpitations, shortness of breath, prolonged cough, wheezing, sleep apnea, coughing up blood, abdominal pain, blood in stool, nausea, vomiting, diarrhea, heartburn, incontinence, blood in urine, muscle weakness, joint pain, leg swelling, rash, skin lesions, headache, fainting, dizziness, depression, anxiety,  enlarged lymph nodes, easy bruising or bleeding, and environmental allergies.         See HPI  Vital Signs:  Patient profile:   78 year old male Height:      71 inches Weight:      186 pounds BMI:     26.04 Pulse rate:   76 / minute Resp:     16 per minute BP sitting:   122 / 60  (right arm)  Vitals Entered By: Marrion Coy, CNA (August 07, 2010  1:51 PM)  Physical Exam  General:  General: Well developed, well nourished, NAD Musculoskeletal: Muscle strength 5/5 all ext Psychiatric: Mood and affect normal Neck: No JVD, no carotid bruits, no thyromegaly, no lymphadenopathy. Lungs:Clear bilaterally, no wheezes, rhonci, crackles CV: RRR no murmurs, gallops rubs Abdomen: soft, NT, ND, BS present Extremities: No edema, pulses trace bilateral DP/PT. Healing ulceration over tip of right great toe. Minimal surrounding erythema.     Arteriogram-Lower Extremity  Procedure date:  07/27/2010  Findings:      1. The distal aorta had diffuse 30% plaque, but no aneurysm and no     severe stenosis. 2. The bilateral renal arteries had mild plaque disease but were     patent. 3. The right common iliac artery and external iliac artery had mild     plaque, but no obstructive lesions.  The right common femoral     artery had plaque disease, but no obstructive lesions.  The right     internal iliac artery had diffuse plaque disease but no severely     obstructive lesions.  The right profunda femoral artery was patent.     The right superficial femoral artery was heavily calcified in the     proximal mid and distal portion.  Proximal portion had an ostial     40% stenosis.  There were serial 70 and 80% lesions throughout the     mid and distal right superficial femoral artery.  The right     popliteal artery appeared to have 50% plaque.  Once again this     vessel was heavily calcified.  The right anterior tibial artery was     occluded at the ostium.  The right peroneal artery was occluded     after its proximal segment.  The right posterior tibial artery was     patent, but became occluded distally.  The dorsalis pedis and     posterior tibial artery filled distally via collateral     revascularization. 4. The left common iliac artery had an ulcerated 30% plaque.  There     was diffuse 30% plaque throughout the left external iliac artery      and left common femoral artery.  The left internal iliac artery had     diffuse plaque that was patent.  The left profundus femoral artery     was patent.  The superficial femoral artery had diffuse     calcification with no stenosis greater than 30% throughout the mid     or distal portion.  The left anterior tibial artery was occluded at     the ostium.  The left peroneal artery was occluded at the ostium.     There was one-vessel runoff to the left foot via the left posterior     tibial artery with obstruction mid and reconstitution distally into the PTA.      Impression & Recommendations:  Problem # 1:  PVD (ICD-443.9) Continue conservative management. Will  wait and see if ulcer heals on the right great toe. If he has any signs of further tissue losss, will have to consider bypass or possibly atherectomy of the SFA to improve inflow into the severely diseased vessesl below the knee. If things worsen, he is at risk of tissue loss possibly requiring amputation. He and his wife understand the plan. Continue to follow with Dr. Wynelle Cleveland. I will see him back in 1 month for follow up.   Patient Instructions: 1)  Your physician recommends that you schedule a follow-up appointment in: 1 month 2)  Your physician recommends that you continue on your current medications as directed. Please refer to the Current Medication list given to you today.

## 2010-09-20 NOTE — Progress Notes (Signed)
  Phone Note Outgoing Call   Call placed by: [name], MD,  September 05, 2010 8:55 AM Summary of Call: I called the patient at 3pm on 09/04/10 and discussed admission for IV antibiotics. He refused admission. Had spoken to Dr. Wynelle Cleveland who saw him on 09/03/10 and started him on by mouth antibiotics.  Initial call taken by: [name], MD,  September 05, 2010 8:57 AM  Follow-up for Phone Call        Spoke to pt this am. He agrees to be admitted. We will arrange and call him back.  Follow-up by: [name], MD,  September 05, 2010 9:21 AM

## 2010-09-20 NOTE — Progress Notes (Signed)
Summary: Records Request  Faxed Vascular Duplex to [facility] ([phone]). Debby Freiberg  July 02, 2010 2:54 PM

## 2010-09-20 NOTE — Miscellaneous (Signed)
Summary: Orders Update  Clinical Lists Changes  Problems: Added new problem of ATHEROSLERO NATIVE ART EXTREMITIES W/ULCERATION (ICD-440.23) Orders: Added new Test order of Arterial Duplex Lower Extremity (Arterial Duplex Low) - Signed 

## 2010-09-21 ENCOUNTER — Encounter (INDEPENDENT_AMBULATORY_CARE_PROVIDER_SITE_OTHER): Payer: Self-pay | Admitting: *Deleted

## 2010-09-21 ENCOUNTER — Ambulatory Visit (INDEPENDENT_AMBULATORY_CARE_PROVIDER_SITE_OTHER): Payer: Medicare Other | Admitting: Vascular Surgery

## 2010-09-21 DIAGNOSIS — F17200 Nicotine dependence, unspecified, uncomplicated: Secondary | ICD-10-CM | POA: Insufficient documentation

## 2010-09-21 DIAGNOSIS — M629 Disorder of muscle, unspecified: Secondary | ICD-10-CM

## 2010-09-26 NOTE — Miscellaneous (Signed)
Summary: Problems and Medications updated  Clinical Lists Changes  Problems: Added new problem of CELLULITIS AND ABSCESS OF LEG EXCEPT FOOT (ICD-682.6) - right lower extremity Added new problem of TOBACCO ABUSE (ICD-305.1) Added new problem of Status post  REPAIR PRIMARY OPEN/PRQ RUPTURED ACHILLES TENDO ([id number]) Medications: Added new medication of KEFLEX 500 MG CAPS (CEPHALEXIN) Take 1 capsule by mouth four times a day Added new medication of NORVASC 10 MG TABS (AMLODIPINE BESYLATE) Take 1 tablet by mouth once a day Added new medication of PLETAL 50 MG TABS (CILOSTAZOL) Take 1 tablet by mouth two times a day

## 2010-09-28 NOTE — Assessment & Plan Note (Signed)
OFFICE VISIT  COYT, GOVONI DOB:  Oct 10, 1923                                       09/21/2010 [id number]#:[id number]  HISTORY OF PRESENT ILLNESS:  This is an 77 year old gentleman that on September 10, 2010 I did a diagnostic angiogram on his right leg.  This angiogram demonstrated unfortunately a  reconstituted posterior tibial artery that was about 2 mm at the level below the knee and also evidence of stenoses and calcifications throughout the plantar arch,  and also there was evidence of pedal filling despite the extent of disease.  This patient notes at this point no more pain in the right foot.  He feels that actually his foot looks better than prior.  His past medical history, past surgical history, social history, family history, review of systems, allergies and medications are completely unchanged from his inpatient consultation.  PHYSICAL EXAMINATION:  Vital signs:  Blood pressure 190/84, heart rate 97, respirations were 22. General:  Well-developed, well-nourished, no apparent distress. Pulmonary:  Clear to auscultation bilaterally. Cardiac:  Regular rate and rhythm.  Normal S1-S2. Vascular:  He had palpable femorals in his left groin where he had cannulated femoral artery.  There is no evidence of a hematoma or any type of pseudoaneurysm.  In regards to his popliteals, I do not feel a palpable popliteal on either side and there was no palpable pedal pulse on either side. Abdomen:  Obese, soft, abdomen nontender, nondistended.  No guarding or rebound. Musculoskeletal:  He has 5/5 strength in all extremities.  Dorsiflexion and plantar flexion were intact in the right foot.  The right toe continues to have some mild erythema.  I briefly debrided some of the hypertrophic skin, and beneath this tissue is actually normal-appearing skin.  There was a small area of eschar that I gently removed with a pair of tweezers, and the tissue underneath this is  actually healthy- appearing skin.  There is no drainage whatsoever from this.  It looks also like he has some degree of onychomycosis in both feet, and his great toenail will likely spontaneously debride.  I do not see any frank abscess here at this point.  There is a hammertoe deformity of this toe.  MEDICAL DECISION MAKING:  This is an 77 year old gentleman that I have consulted on in the hospital with peripheral arterial disease. Unfortunately, his right foot demonstrates disease that would not benefit from a bypass operation.  With a target only about 2 mm and additionally evidence of stenosis within the plantar arch, any bypass is doomed to fail eventually.  We talked about possibly performing an orbital atherectomy and possible angioplasty of the SFA, possible peroneal artery.  At this point the patient appears to be healing with the perfusion that he has, so I do not feel that immediately intervention is necessary, and the patient at this point would like to defer any further intervention also.  If in the next month or so he is not able to completely heal his wounds in his foot, at that point I would consider possibly intervening on him again.  At this point the patient is going to follow up with [place] as needed.  Like I said, if in the next month he does not have resolution of his right foot wounds, at that point we will consider doing an orbital arthrectomy and angioplasty. Please note that  however this would not directly improve the blood flow, as he does not have in-line flow to the right foot.  I should also note at this point that the patient does not want any type of amputation.    Fransisco Hertz, MD Electronically Signed  BLC/MEDQ  D:  09/21/2010  T:  09/24/2010  Job:  [id number]

## 2010-10-03 ENCOUNTER — Ambulatory Visit (INDEPENDENT_AMBULATORY_CARE_PROVIDER_SITE_OTHER): Payer: Medicare Other | Admitting: Internal Medicine

## 2010-10-03 ENCOUNTER — Encounter: Payer: Self-pay | Admitting: Internal Medicine

## 2010-10-03 DIAGNOSIS — L02419 Cutaneous abscess of limb, unspecified: Secondary | ICD-10-CM

## 2010-10-10 NOTE — Assessment & Plan Note (Signed)
Summary: [facility] need chart/rt foot cellulitis/kam   Vital Signs:  Patient profile:   77 year old male Height:      71 inches (180.34 cm) Weight:      194 pounds (88.18 kg) BMI:     27.16 Temp:     97.4 degrees F (36.33 degrees C) oral Pulse rate:   96 / minute BP sitting:   150 / 68  (left arm) Cuff size:   large  Vitals Entered By: Jennet Maduro RN (October 03, 2010 9:32 AM) CC: [facility] Is Patient Diabetic? No Pain Assessment Patient in pain? no      Nutritional Status BMI of 25 - 29 = overweight Nutritional Status Detail appetite "good"  Have you ever been in a relationship where you felt threatened, hurt or afraid?No   Does patient need assistance? Functional Status Self care Ambulation Normal   Primary Provider:  [name]  CC:  [facility].  History of Present Illness: Mr. Quantavius Humm is in for his hospital follow-up visit.  He was hospitalized last month with right great toe cellulitis.  He had some drainage at the base of his toenail and a swab culture grew MSSA. He improved with IV vancomycin and was discharged home on cephalexin which he is still taking.  He is feeling much better.  Preventive Screening-Counseling & Management  Alcohol-Tobacco     Alcohol drinks/day: 0     Smoking Status: never  Caffeine-Diet-Exercise     Caffeine use/day: yes     Does Patient Exercise: no  Current Medications (verified): 1)  Celebrex 200 Mg Caps (Celecoxib) .[name].. 1 Cap Once Daily 2)  Spiriva Handihaler 18 Mcg Caps (Tiotropium Bromide Monohydrate) .[name].. 1 Puff Once Daily 3)  Alprazolam 0.5 Mg Tabs (Alprazolam) .... 1/2 Tab At Bedtime and As Needed 4)  Proair Hfa 108 (90 Base) Mcg/act Aers (Albuterol Sulfate) .... Use As Directed As Needed 5)  Fluticasone Propionate 50 Mcg/act Susp (Fluticasone Propionate) .... As Needed 6)  Multivitamins   Tabs (Multiple Vitamin) .[name].. 1 Tab Once Daily 7)  Preservision Areds  Caps (Multiple Vitamins-Minerals) .... 2 Caps Once Daily 8)   Aspirin 81 Mg Tbec (Aspirin) .... Take One Tablet By Mouth Daily 9)  Norvasc 10 Mg Tabs (Amlodipine Besylate) .... Take 1 Tablet By Mouth Once A Day 10)  Pletal 50 Mg Tabs (Cilostazol) .... Take 1 Tablet By Mouth Two Times A Day 11)  Keflex 500 Mg Caps (Cephalexin) .... Take 1 Capsule By Mouth Four Times A Day  Allergies: No Known Drug Allergies  Physical Exam  General:  alert and well-nourished.   Extremities:  the cellulitis has resolved.  He has a small callus at the tip of his right great toe.  There is no drainage.   Impression & Recommendations:  Problem # 1:  CELLULITIS AND ABSCESS OF LEG EXCEPT FOOT (ICD-682.6) An outpatient x-ray revealed a possible fracture in right great toe distal phalanx.  However, there was no clear evidence of osteomyelitis.  I will have him stop the cephalexin when his current supply runs out at the end of this month. His updated medication list for this problem includes:    Aspirin 81 Mg Tbec (Aspirin) .[name]... Take one tablet by mouth daily    Keflex 500 Mg Caps (Cephalexin) .[name]... Take 1 capsule by mouth four times a day  Orders: Est. Patient Level III (19147)  Medications Added to Medication List This Visit: 1)  Alprazolam 0.5 Mg Tabs (Alprazolam) .... 1/2 tab at bedtime  and as needed 2)  Keflex 500 Mg Caps (Cephalexin) .... Take 1 capsule by mouth four times a day  Patient Instructions: 1)  Please schedule a follow-up appointment as needed.   Orders Added: 1)  Est. Patient Level III [04540]

## 2010-10-16 NOTE — Letter (Signed)
Summary: Discharge Summary 09/11/09  Discharge Summary 09/11/09   Imported By: Florinda Marker 10/12/2010 10:44:00  _____________________________________________________________________  External Attachment:    Type:   Image     Comment:   External Document

## 2010-10-26 ENCOUNTER — Encounter: Payer: Self-pay | Admitting: *Deleted

## 2010-10-29 ENCOUNTER — Encounter: Payer: Self-pay | Admitting: Cardiovascular Disease

## 2010-10-29 ENCOUNTER — Encounter (INDEPENDENT_AMBULATORY_CARE_PROVIDER_SITE_OTHER): Payer: Medicare Other | Admitting: Cardiovascular Disease

## 2010-10-29 DIAGNOSIS — I739 Peripheral vascular disease, unspecified: Secondary | ICD-10-CM

## 2010-11-05 ENCOUNTER — Other Ambulatory Visit: Payer: Self-pay | Admitting: Cardiovascular Disease

## 2010-11-05 DIAGNOSIS — L98499 Non-pressure chronic ulcer of skin of other sites with unspecified severity: Secondary | ICD-10-CM

## 2010-11-06 NOTE — Assessment & Plan Note (Signed)
Summary: eph/ Appt made by Ludger Nutting   Visit Type:  Follow-up Primary Provider:  [name]  CC:  NO complaints. .  History of Present Illness: 77 yo male with history of PVD, prior carotid endarterectomy, HTN and former tobacco abuse here today for PV follow up  He has an ulcer on his right great toe which has been folllowed by Dr. Leonia Reeves. Doppler studies done on 06/22/10 with suggestion of bilateral lower ext disease. His ABI were  mildly reduced in both legs.  I arranged a distal aortogram with lower extremity runoff on 12/0/11. He was found to have severe PAD in the right lower extremity with severe disease in the right SFA in the mid and distal segment with 1 vessel runoff to the right foot via the PTA  however the PTA was severely diseased in the mid segment with total obstruction and an extensive collateral network with reconstitution distally. I discussed his case with vascular surgery. There were no good percutaneous options for revascularization. We decided to manage conservatively however we admitted him in January for IV antiobiotics because the toe wound was not healing and there was erythema in the right foot c/w cellulitis and worrisome for advancement of infection into the foot. He did well. Repeat angiography per Dr. Imogene Burn with Vascular Surgery in January with no change in disease. Plans for conservative management with atherectomy if needed in the future in the right SFA.   He is here today for follow up. His right toe wound is healing well. He last saw Dr. Imogene Burn in follow up on 09/21/10. He saw Dr. Orvan Falconer with ID three weeks ago. Antibiotics stopped last month. He is doing well. No complaints.    Current Medications (verified): 1)  Celebrex 200 Mg Caps (Celecoxib) .[name].. 1 Cap Once Daily 2)  Spiriva Handihaler 18 Mcg Caps (Tiotropium Bromide Monohydrate) .[name].. 1 Puff Once Daily 3)  Alprazolam 0.5 Mg Tabs (Alprazolam) .... 1/2 Tab At Bedtime and As Needed 4)  Proair Hfa  108 (90 Base) Mcg/act Aers (Albuterol Sulfate) .... Use As Directed As Needed 5)  Fluticasone Propionate 50 Mcg/act Susp (Fluticasone Propionate) .... As Needed 6)  Multivitamins   Tabs (Multiple Vitamin) .[name].. 1 Tab Once Daily 7)  Preservision Areds  Caps (Multiple Vitamins-Minerals) .... 2 Caps Once Daily 8)  Aspirin 81 Mg Tbec (Aspirin) .... Take One Tablet By Mouth Daily 9)  Norvasc 10 Mg Tabs (Amlodipine Besylate) .... Take 1 Tablet By Mouth Once A Day 10)  Pletal 50 Mg Tabs (Cilostazol) .... Take 1 Tablet By Mouth Two Times A Day  Allergies (verified): No Known Drug Allergies  Past History:  Past Medical History: HYPERTENSION (ICD-401.9) DIZZINESS (ICD-780.4) DIVERTICULOSIS, COLON, HX OF (ICD-V12.79) ASTHMA (ICD-493.90) PAD-severe bilateral lower ext disease. Runoff 1/12 per Dr. Imogene Burn with severe disease R SFA and below the knee  Social History: Reviewed history from 07/04/2010 and no changes required.  The patient is married.   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   He did smoke in the past, but quit 30 years ago.  No alcohol No ilicit drug use  Review of Systems  The patient denies fatigue, malaise, fever, weight gain/loss, vision loss, decreased hearing, hoarseness, chest pain, palpitations, shortness of breath, prolonged cough, wheezing, sleep apnea, coughing up blood, abdominal pain, blood in stool, nausea, vomiting, diarrhea, heartburn, incontinence, blood in urine, muscle weakness, joint pain, leg swelling, rash,  skin lesions, headache, fainting, dizziness, depression, anxiety, enlarged lymph nodes, easy bruising or bleeding, and environmental allergies.    Vital Signs:  Patient profile:   77 year old male Height:      71 inches Weight:      189 pounds BMI:     26.46 Pulse rate:   86 / minute BP sitting:   132 / 58  (left arm)  Vitals Entered By: Laurance Flatten CMA (October 29, 2010 9:13  AM)  Physical Exam  General:  General: Well developed, well nourished, NAD Musculoskeletal: Muscle strength 5/5 all ext Psychiatric: Mood and affect normal Neck: No JVD, no carotid bruits, no thyromegaly, no lymphadenopathy. Lungs:Clear bilaterally, no wheezes, rhonci, crackles CV: RRR no murmurs, gallops rubs Abdomen: soft, NT, ND, BS present Extremities: No edema, pulses trace bilateral DP/PT. Healing ulceration over tip of right great toe. No surrounding erythema.    Impression & Recommendations:  Problem # 1:  PVD (ICD-443.9) Stable. He has severe disease in the right SFA but the right great toe wound is healing well. No change in therapy. Repeat ABI in 3 months. Follow up with me in 3 months.   Other Orders: Arterial Duplex Lower Extremity (Arterial Duplex Low)  Patient Instructions: 1)  Your physician recommends that you schedule a follow-up appointment in: 3 months 2)  Your physician recommends that you continue on your current medications as directed. Please refer to the Current Medication list given to you today. 3)  Your physician has requested that you have an ankle brachial index (ABI) in 3 months.  During this test an ultrasound and blood pressure cuff are used to evaluate the arteries that supply the arms and legs with blood. Allow thirty minutes for this exam. There are no restrictions or special instructions.

## 2011-01-01 NOTE — Procedures (Signed)
[name], [name]             ACCOUNT NO.:  [id number]   MEDICAL RECORD NO.:  [id number]          PATIENT TYPE:  AMB   LOCATION:  SDS                          FACILITY:  [facility]   PHYSICIAN:  [name], MDDATE OF BIRTH:  Dec 24, 1923   DATE OF PROCEDURE:  07/27/2010  DATE OF DISCHARGE:                    PERIPHERAL VASCULAR INVASIVE PROCEDURE    PRIMARY CARE PHYSICIAN:  Gaspar Garbe, MD   PRIMARY PODIATRIST:  Eugenio Hoes. Petrinitz, DPM   PROCEDURE PERFORMED:  Distal aortogram with bilateral lower extremity  runoff.   OPERATOR:  [name], MD   INDICATIONS:  This is an 77 year old Caucasian male with a history of  known peripheral arterial disease, prior carotid endarterectomy,  hypertension and former tobacco abuse, who I have seen recently for  peripheral arterial disease related to a nonhealing ulcer of the right  great toe.  The patient's noninvasive studies suggested at least  moderate disease in the right superficial femoral artery with possible  disease below the knee in both legs.  We decided to bring him in for a  distal aortogram with bilateral lower extremity runoff to better define  his disease as he does have an ulceration on the tip of his right great  toe that has been slowly healing.   DETAILS OF PROCEDURE:  The patient was brought to the main peripheral  vascular laboratory after signing an informed consent for the procedure.  The left groin was prepped and draped in sterile fashion.  A 1%  lidocaine was used for local anesthesia.  A 5-French sheath was inserted  into the left femoral artery without difficulty.  A pigtail catheter was  then advanced over a wire without difficulty into the distal aorta just  above the takeoff of the bilateral renal arteries.  We performed one  angiogram defining the distal aorta down to the level of the aortic  bifurcation.  The bilateral renal arteries were also visualized in this  projection.  At  this point, a pigtail catheter was pulled down to the  level of the aortic bifurcation and a pelvic angiogram was performed.  We then performed bilateral lower extremity runoff observing the blood  flow to both feet.  We then better defined the flow to the right great  toe by performing lateral injection of the foot.  The patient tolerated  the procedure well.  He was taken to the holding area in stable  condition.   HEMODYNAMIC FINDINGS:  Central aortic pressure 210/110.   ANGIOGRAPHIC FINDINGS:  1. The distal aorta had diffuse 30% plaque, but no aneurysm and no      severe stenosis.  2. The bilateral renal arteries had mild plaque disease but were      patent.  3. The right common iliac artery and external iliac artery had mild      plaque, but no obstructive lesions.  The right common femoral      artery had plaque disease, but no obstructive lesions.  The right      internal iliac artery had diffuse plaque disease but no severely      obstructive lesions.  The right profunda femoral  artery was patent.      The right superficial femoral artery was heavily calcified in the      proximal mid and distal portion.  Proximal portion had an ostial      40% stenosis.  There were serial 70 and 80% lesions throughout the      mid and distal right superficial femoral artery.  The right      popliteal artery appeared to have 50% plaque.  Once again this      vessel was heavily calcified.  The right anterior tibial artery was      occluded at the ostium.  The right peroneal artery was occluded      after its proximal segment.  The right posterior tibial artery was      patent, but became occluded distally.  The dorsalis pedis and      posterior tibial artery filled distally via collateral      revascularization.  4. The left common iliac artery had an ulcerated 30% plaque.  There      was diffuse 30% plaque throughout the left external iliac artery      and left common femoral artery.  The  left internal iliac artery had      diffuse plaque that was patent.  The left profundus femoral artery      was patent.  The superficial femoral artery had diffuse      calcification with no stenosis greater than 30% throughout the mid      or distal portion.  The left anterior tibial artery was occluded at      the ostium.  The left peroneal artery was occluded at the ostium.      There was one-vessel runoff to the left foot via the left posterior      tibial artery.   The angiography with the lateral projection of the ankle and foot shows  reconstitution of the right dorsalis pedis artery via bladder.  It  appears there was occlusion in the distal posterior tibial artery and a  large collateral branch that reconstitutes flow to the dorsalis pedis  artery and posterior tibial artery.   IMPRESSION:  1. Nonhealing ulceration of the right great toes.  2. Severely obstructive disease throughout a heavily calcified right      superficial femoral artery with total occlusion of two out of the      three below-knee vessels.  There is reconstitution of flow in the      right foot via collaterals.  3. Severe disease in the below-knee vessels in the left leg with      occlusion of 2/3 vessels with one-vessel runoff to the left foot      via the left posterior tibial artery.   RECOMMENDATIONS:  [name] [name] has advanced peripheral arterial disease.  I have had a long discussion with the patient and his family about  possible options.  I would like to review his angiograms with one of our  vascular surgeons.  I think option one would be that we continue to  medically manage his disease and give his ulceration on the right great  toe time to heal.  If this is not healed and we could consider going in  and performing a bypass procedure.  Once again, I would like to discuss  this option with the surgeons before we speak about this further with  the family.  Another option would be to perform  atherectomy with the  [place] atherectomy  device throughout the proximal mid and distal  superficial femoral artery in an attempt to create more brisk flow into  the below the knee vessels.  Biggest concern about the percutaneous  approach is that we could embolize distally into this patient for single-  vessel runoff and compromise any significant flow to his right foot.  We  will stop for today and we will hydrate the patient postprocedure to  give his kidneys time to clear off contrast dye.  We will discharge him  home later today.  After having had more time to review his films with  my surgical colleagues then we will make further plans.      [name], MD      CM/MEDQ  D:  07/27/2010  T:  07/28/2010  Job:  [id number]   cc:   Tinnie Gens A. Petrinitz, D.P.M.   Electronically Signed by [name] MD on 07/31/2010 01:28:51 PM

## 2011-01-01 NOTE — Assessment & Plan Note (Signed)
[facility]                            CARDIOLOGY OFFICE NOTE   [name] [name], [name] [name]               MRN:          [id number]  DATE:08/24/2008                            DOB:          Sep 13, 1923    [name] [name] is a very pleasant, healthy-appearing 77 year old gentleman.  He is referred for overall cardiac evaluation by Dr. Duaine Dredge.  [name] [name] is active.  He has had some chest tightness with exertion  recently.  He does have known vascular disease.  His chest tightness is  in the anterior chest.  He has no nausea, vomiting, or diaphoresis.  There is no radiation.  Has noted it is with exercise.  He has had some  asthma by history and he is wondered at times of this tightness could be  from his lungs.  He has history of a carotid endarterectomy done many  years ago with the right neck by Dr. Karle Plumber.  He also has  hypertension and elevated cholesterol.   PAST MEDICAL HISTORY:   ALLERGIES:  No known drug allergies.   MEDICATIONS:  1. Atenolol 50.  2. Lisinopril 10.  3. Lovastatin 10.  4. Fluticasone 50 mcg.  5. Alprazolam at night.   OTHER MEDICAL PROBLEMS:  See the list below.   SOCIAL HISTORY:  The patient is married.  He is retired from the Photographer  business.  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  He did smoke in the past, but quit 30 years ago.   FAMILY HISTORY:  There is no strong family history of coronary disease  at a young age.   REVIEW OF SYSTEMS:  He is not having any fevers or chills.  He does not  have headaches.  There is no significant GI or GU symptoms.  There is a  history of diverticulosis, but this is inactive.  He does complain of  some tightness in his thighs with walking.  This may well be  claudication.  Otherwise, his review of systems is negative.   PHYSICAL EXAMINATION:  VITAL SIGNS:  Blood pressure today is 124/70 with  the pulse of 68.  Weight is 197 pounds.  GENERAL:  The patient is oriented to person, time, and place.  Affect is  normal.  HEENT:  No xanthelasma.  He has normal extraocular motion.  There are no  carotid bruits.  There is no jugular venous distention.  LUNGS:  Clear.  Respiratory effort is not labored.  CARDIAC:  S1 with an S2.  There are no clicks or significant murmurs.  ABDOMEN:  Soft.  He has no significant peripheral edema.  He does have  decreased distal pulses.   EKG reveals normal sinus rhythm with no diagnostic abnormalities.   PROBLEMS:  1. History of chronic obstructive pulmonary disease and asthma.  2. History of diverticulosis in the past.  He actually underwent      surgery by Dr. Cicero Duck in the past and had a colostomy for.      At time that was reversed.  More recently, he had a superficial      incision  abnormality related to an old suture.  This was treated      and repaired successfully.  3. History of some dizziness in the past.  This is not marked.  In the      remote past, he had a procedure to his right ear drum.  4. Hypertension, treated.  5. Hypercholesterolemia.  He is on low-dose lovastatin.  Followup      lipid profile of course will be appropriate per Dr. Duaine Dredge.  6. Question of claudication.  He may have bilateral hip claudication.      He does have decreased distal pulses.  7. Status post right carotid endarterectomy in the past.  He was      followed for several years by Dr. Edwyna Shell and eventually released.      He will need follow up carotid Doppler.  8. With exertional chest discomfort.  With multiple risk factors, the      patient may well have exertional angina.  I have chosen not yet to      adjust his medicines.  We will proceed with a stress Myoview scan.      I want to see his overall exercise tolerance.  If he is too limited      by claudication, I believe that he can have an adenosine study.  I      believe this will be safe for him despite a prior lung disease       history.  He is not on any bronchodilators.  I will then see him      back for followup.  We will address the other issues over time.  I      will recheck to see if he is on aspirin.  If not, this will be      started unless there is an absolute contraindication.     Luis Abed, MD, [facility]  Electronically Signed    JDK/MedQ  DD: 08/24/2008  DT: 08/25/2008  Job #: [id number]   cc:   Mosetta Putt, M.D.

## 2011-01-01 NOTE — Assessment & Plan Note (Signed)
[facility]                            CARDIOLOGY OFFICE NOTE   [name], [name]               MRN:          [id number]  DATE:09/28/2008                            DOB:          Jan 08, 1924    Mr. [name] [name] is here for Cardiology followup.  He is a very active  gentleman.  He has slight chest congestion when he is doing heavy  physical exercise.  He does have a known vascular disease in the past.  When I saw him on August 24, 2008, we decided to proceed with a stress  Myoview scan.  This was done on September 09, 2008.  There is question of  possibly very slight inferior ischemia.  This is a borderline call.  This is a low-risk scan.  He is now back for followup.  I have re-  reviewed his history with him.  I feel that no further cardiac workup is  necessary.  It is possible that his chest discomfort may be slight  exertional angina.  For now, he can be followed.  Aggressive secondary  prevention will be helpful.  He has been followed by Dr. Duaine Dredge for  over the years.  His care will be changing to Dr. Wylene Simmer.   PAST MEDICAL HISTORY:  Allergies:  No known drug allergies.   MEDICATIONS:  1. Atenolol 50.  2. Lisinopril 10.  3. Lovastatin 10.  4. Aspirin.  5. A Ventolin inhaler.   OTHER MEDICAL PROBLEMS:  See the list below.   REVIEW OF SYSTEMS:  Today, he has no fevers or chills.  There is no  headache.  There are no eye problems.  He has no GI or GU symptoms.  His  review of systems is negative.   PHYSICAL EXAMINATION:  VITAL SIGNS:  Blood pressure 145/70 with a pulse  of 70.  The patient is oriented to person, time, and place.  Affect is  normal.  HEENT:  Reveals no xanthelasma.  He has normal extraocular motion.  NECK:  There are no carotid bruits.  There is no jugular venous  distention.  LUNGS:  Clear.  Respiratory effort is not labored.  CARDIAC:  Reveals S1 and S2.  There are no clicks or significant  murmurs.  ABDOMEN:   Soft.  EXTREMITIES:  He has no peripheral edema.   PROBLEMS:  1. History of COPD and asthma.  2. History of diverticulosis.  He had surgery with a colostomy in the      past, and then this was reversed.  3. History of some dizziness in the past.  4. Hypertension treated.  5. Hypercholesterolemia.  I suspect he needs more aggressive      evaluation and treatment of his lipids.  6. Question of bilateral claudication, but this is only mild.  7. History of right carotid endarterectomy in the past.  He will need      a followup carotid Doppler in the future.  8. Mild chest discomfort.  This may be his lung disease, and he may      have slight exertional angina.  However, his Myoview scan is low  risk.  No further aggressive workup is needed at this time.  I will      see him for followup in 1 year.     [name] Abed, MD, [facility]  Electronically Signed    JDK/MedQ  DD: 09/28/2008  DT: 09/29/2008  Job #: [id number]   cc:   Gaspar Garbe, M.D.

## 2011-01-04 NOTE — Op Note (Signed)
[place]  Patient:    [name] [name], [name] [name]                     MRN: [id number] Proc. Date: 09/24/00 Adm. Date:  [id number] Attending:  Colbert Ewing                           Operative Report  PREOPERATIVE DIAGNOSIS:  Acute on chronic complete Achilles tendon rupture, right.  POSTOPERATIVE DIAGNOSIS:  Acute on chronic complete Achilles tendon rupture, right.  PROCEDURE:  Exploration debridement, repair of Achilles tendon rupture, right.  SURGEON:  Loreta Ave, M.D.  ASSISTANT:  Arlys John D. Petrarca, P.A.-C.  ANESTHESIA:  Spinal.  ESTIMATED BLOOD LOSS:  Minimal.  TOURNIQUET TIME:  45 minutes.  SPECIMENS:  None.  CULTURES:  None.  COMPLICATIONS:  None.  DRESSING:  Sterile compressive with a short leg splint appropriately padded.  PROCEDURE:  Patient brought to the operating room and placed on the operating table in the supine position.  After adequate anesthesia had been obtained turned into a prone position.  Appropriate padding and support.  Tourniquet upper aspect right leg.  The leg examined with an obvious complete defect in the Achilles tendon 5-6 cm above the os calces attachment.  Leg was prepped and draped and then exsanguinated with elevation and Esmarch.  Tourniquet inflated to 300 mmHg.  Longitudinal incision lateral to the Achilles tendon. Skin and subcutaneous tissue divided keeping the skin flaps as thick as possible.  Marked acute on chronic tearing with a complete defect in the Achilles right in the typical watershed region.  All of the hypertrophic inflammatory mucinous debris completely excised.  The tendon sheath itself was completely degenerative and not really a repairable solid structure.  Margins of the defect were thoroughly debrided back to healthy tissue on either side. This left a defect of about 2 cm after all the granulation tissue and hypertrophic nonfunctional tissue was removed.  The distal  and proximal end-systolic were then well captured with a weaved #2 fiber wire suture.  The foot was then brought into a plantar flexed position and the sutures firmly tied in place with the knots being left down in the defect of the tendon.  This gave excellent restoration of length, continuity and Integrity of the repair.  This was oversewn with Vicryl to contour this.  The wound was then copiously irrigated.  I could bring him to a plantigrade position with the knee flexed at 90 degrees.  The wound was then irrigated again.  Subcutaneous closure with Vicryl and then skin with nylon.  Margins of the wound injected with Marcaine.  Sterile compressive dressing with short leg splint applied. Tourniquet inflated and removed.  Returned to supine position.  Anesthesia reversed and brought to recovery room.  Tolerated surgery well with no complications. DD:  09/24/00 TD:  09/25/00 Job: [id number]

## 2011-01-15 ENCOUNTER — Encounter: Payer: Self-pay | Admitting: Cardiovascular Disease

## 2011-01-22 ENCOUNTER — Encounter (INDEPENDENT_AMBULATORY_CARE_PROVIDER_SITE_OTHER): Payer: Medicare Other | Admitting: *Deleted

## 2011-01-22 ENCOUNTER — Ambulatory Visit (INDEPENDENT_AMBULATORY_CARE_PROVIDER_SITE_OTHER): Payer: Medicare Other | Admitting: Cardiovascular Disease

## 2011-01-22 ENCOUNTER — Encounter: Payer: Self-pay | Admitting: Cardiovascular Disease

## 2011-01-22 ENCOUNTER — Other Ambulatory Visit: Payer: Self-pay | Admitting: *Deleted

## 2011-01-22 VITALS — BP 142/72 | HR 85 | Resp 14 | Ht 71.0 in | Wt 185.0 lb

## 2011-01-22 DIAGNOSIS — I739 Peripheral vascular disease, unspecified: Secondary | ICD-10-CM

## 2011-01-22 DIAGNOSIS — L98499 Non-pressure chronic ulcer of skin of other sites with unspecified severity: Secondary | ICD-10-CM

## 2011-01-22 NOTE — Patient Instructions (Addendum)
Your physician recommends that you schedule a follow-up appointment in: 6 months  Your physician has requested that you have an ankle brachial index (ABI). During this test an ultrasound and blood pressure cuff are used to evaluate the arteries that supply the arms and legs with blood. Allow thirty minutes for this exam. There are no restrictions or special instructions. In 6 months.   STOP PLETAL.

## 2011-01-22 NOTE — Progress Notes (Signed)
History of Present Illness:77 yo male with history of PVD, prior carotid endarterectomy, HTN and former tobacco abuse here today for PV follow up  He has an ulcer on his right great toe which has been folllowed by Dr. Wynelle Cleveland. Doppler studies done on 06/22/10 with suggestion of bilateral lower ext disease. His ABI were  mildly reduced in both legs.  I arranged a distal aortogram with lower extremity runoff on 12/0/11. He was found to have severe PAD in the right lower extremity with severe disease in the right SFA in the mid and distal segment with 1 vessel runoff to the right foot via the PTA  however the PTA was severely diseased in the mid segment with total obstruction and an extensive collateral network with reconstitution distally. I discussed his case with vascular surgery. There were no good percutaneous options for revascularization. We decided to manage conservatively however we admitted him in January for IV antiobiotics because the toe wound was not healing and there was erythema in the right foot c/w cellulitis and worrisome for advancement of infection into the foot. He did well. Repeat angiography per Dr. Imogene Burn with Vascular Surgery in January with no change in disease. Plans for conservative management with atherectomy if needed in the future in the right SFA.   He is here today for follow up. His right toe wound has healed well. He last saw Dr. Imogene Burn in follow up on 09/21/10. Dr. Imogene Burn planned prn follow up. He is no longer seeing Dr. Wynelle Cleveland. He is feeling great. His feet hurt occasionally. He is having some bruising on his arms. He wishes to stop his Cilostazil. He also notes frequent urination at night. No prior prostate issues.  Past Medical History  Diagnosis Date  . HTN (hypertension)   . Dizziness   . Diverticulosis of colon   . Asthma   . PAD (peripheral artery disease)     severe bilateral lower ext disease. Runoff 1/12 per Cr. Chen with severe disease R SFA and below the knee     Past Surgical History  Procedure Date  . Right carotic endarentomy 2005  . Exploration debridement     repair of Achilles Tendon repture, right  . Appendectomy   . Tonsillectomy   . Perforated large intestines     Current Outpatient Prescriptions  Medication Sig Dispense Refill  . albuterol (PROAIR HFA) 108 (90 BASE) MCG/ACT inhaler as directed.        [name] ALPRAZolam (XANAX) 0.5 MG tablet Take 0.25 mg by mouth at bedtime as needed.        [name] amLODipine (NORVASC) 10 MG tablet Take 10 mg by mouth daily.        [name] aspirin 81 MG tablet Take 81 mg by mouth daily.        . celecoxib (CELEBREX) 200 MG capsule Take 200 mg by mouth daily.        . cephALEXin (KEFLEX) 500 MG capsule Take 500 mg by mouth 4 (four) times daily.        . cilostazol (PLETAL) 50 MG tablet Take 50 mg by mouth 2 (two) times daily.        . fluticasone (FLONASE) 50 MCG/ACT nasal spray as needed.        . Multiple Vitamin (MULTIVITAMIN PO) Take 1 tablet by mouth daily.        . Multiple Vitamins-Minerals (PRESERVISION AREDS) CAPS Take 2 capsules by mouth daily.        [name] tiotropium (SPIRIVA HANDIHALER)  18 MCG inhalation capsule Place 18 mcg into inhaler and inhale daily.          No Known Allergies  History   Social History  . Marital Status: Married    Spouse Name: N/A    Number of Children: N/A  . Years of Education: N/A   Occupational History  . 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 Smoking status: Former Games developer  . Smokeless tobacco: Not on file   Comment: quit 30 years ago  . Alcohol Use: No  . Drug Use: No  . Sexually Active: Not on file   Other Topics Concern  . Not on file   Social History Narrative  . No narrative on file    Family History  Problem Relation Age of Onset  . Liver disease Brother     Review of Systems:  As stated in the HPI and otherwise negative.   BP 142/72  Pulse 85  Resp 14   Ht 5\' 11"  (1.803 m)  Wt 185 lb (83.915 kg)  BMI 25.80 kg/m2  Physical Examination: General: Well developed, well nourished, NAD HEENT: OP clear, mucus membranes moist SKIN: warm, dry. No rashes. Neuro: No focal deficits Musculoskeletal: Muscle strength 5/5 all ext Psychiatric: Mood and affect normal Neck: No JVD, no carotid bruits, no thyromegaly, no lymphadenopathy. Lungs:Clear bilaterally, no wheezes, rhonci, crackles Cardiovascular: Regular rate and rhythm. No murmurs, gallops or rubs. Abdomen:Soft. Bowel sounds present. Non-tender.  Extremities: No lower extremity edema. Pulses are non-palpable in the bilateral DP/PT. Right great toe healed well. No erythema.   ABI: 01/22/11: Right ABI 0.80, Left ABI 0.90.

## 2011-01-22 NOTE — Assessment & Plan Note (Signed)
Stable. Right great toe has healed well. Continue conservative management. He wishes to stop Pletal secondary to easy bruising. Will continue daily foot exams at home, ASA 81 mg po Qdaily. He will f/u in 6 months for ABI and will see me then. He will call Dr. Darral Dash about his urinary issues.

## 2011-01-25 ENCOUNTER — Encounter: Payer: Self-pay | Admitting: Cardiovascular Disease

## 2011-09-04 DIAGNOSIS — Z125 Encounter for screening for malignant neoplasm of prostate: Secondary | ICD-10-CM | POA: Diagnosis not present

## 2011-09-04 DIAGNOSIS — I10 Essential (primary) hypertension: Secondary | ICD-10-CM | POA: Diagnosis not present

## 2011-09-04 DIAGNOSIS — R82998 Other abnormal findings in urine: Secondary | ICD-10-CM | POA: Diagnosis not present

## 2011-09-04 DIAGNOSIS — E785 Hyperlipidemia, unspecified: Secondary | ICD-10-CM | POA: Diagnosis not present

## 2011-09-04 DIAGNOSIS — R972 Elevated prostate specific antigen [PSA]: Secondary | ICD-10-CM | POA: Diagnosis not present

## 2011-09-11 DIAGNOSIS — J45909 Unspecified asthma, uncomplicated: Secondary | ICD-10-CM | POA: Diagnosis not present

## 2011-09-11 DIAGNOSIS — R972 Elevated prostate specific antigen [PSA]: Secondary | ICD-10-CM | POA: Diagnosis not present

## 2011-09-11 DIAGNOSIS — Z125 Encounter for screening for malignant neoplasm of prostate: Secondary | ICD-10-CM | POA: Diagnosis not present

## 2011-09-11 DIAGNOSIS — J449 Chronic obstructive pulmonary disease, unspecified: Secondary | ICD-10-CM | POA: Diagnosis not present

## 2011-09-11 DIAGNOSIS — I739 Peripheral vascular disease, unspecified: Secondary | ICD-10-CM | POA: Diagnosis not present

## 2011-09-11 DIAGNOSIS — Z0000 Encounter for general adult medical examination without abnormal findings: Secondary | ICD-10-CM | POA: Diagnosis not present

## 2011-09-13 DIAGNOSIS — Z1212 Encounter for screening for malignant neoplasm of rectum: Secondary | ICD-10-CM | POA: Diagnosis not present

## 2011-11-28 DIAGNOSIS — H353190 Nonexudative age-related macular degeneration, unspecified eye, stage unspecified: Secondary | ICD-10-CM | POA: Diagnosis not present

## 2011-11-28 DIAGNOSIS — H43819 Vitreous degeneration, unspecified eye: Secondary | ICD-10-CM | POA: Diagnosis not present

## 2011-11-28 DIAGNOSIS — H3530 Unspecified macular degeneration: Secondary | ICD-10-CM | POA: Diagnosis not present

## 2011-11-28 DIAGNOSIS — H2510 Age-related nuclear cataract, unspecified eye: Secondary | ICD-10-CM | POA: Diagnosis not present

## 2012-03-12 DIAGNOSIS — J449 Chronic obstructive pulmonary disease, unspecified: Secondary | ICD-10-CM | POA: Diagnosis not present

## 2012-03-12 DIAGNOSIS — J45909 Unspecified asthma, uncomplicated: Secondary | ICD-10-CM | POA: Diagnosis not present

## 2012-03-12 DIAGNOSIS — I10 Essential (primary) hypertension: Secondary | ICD-10-CM | POA: Diagnosis not present

## 2012-03-12 DIAGNOSIS — E785 Hyperlipidemia, unspecified: Secondary | ICD-10-CM | POA: Diagnosis not present

## 2012-03-17 DIAGNOSIS — I6529 Occlusion and stenosis of unspecified carotid artery: Secondary | ICD-10-CM | POA: Diagnosis not present

## 2012-03-17 DIAGNOSIS — I658 Occlusion and stenosis of other precerebral arteries: Secondary | ICD-10-CM | POA: Diagnosis not present

## 2012-05-31 DIAGNOSIS — Z23 Encounter for immunization: Secondary | ICD-10-CM | POA: Diagnosis not present

## 2012-09-08 DIAGNOSIS — Z125 Encounter for screening for malignant neoplasm of prostate: Secondary | ICD-10-CM | POA: Diagnosis not present

## 2012-09-08 DIAGNOSIS — I10 Essential (primary) hypertension: Secondary | ICD-10-CM | POA: Diagnosis not present

## 2012-09-08 DIAGNOSIS — E785 Hyperlipidemia, unspecified: Secondary | ICD-10-CM | POA: Diagnosis not present

## 2012-09-08 DIAGNOSIS — I2510 Atherosclerotic heart disease of native coronary artery without angina pectoris: Secondary | ICD-10-CM | POA: Diagnosis not present

## 2012-09-08 DIAGNOSIS — I739 Peripheral vascular disease, unspecified: Secondary | ICD-10-CM | POA: Diagnosis not present

## 2012-09-09 DIAGNOSIS — R972 Elevated prostate specific antigen [PSA]: Secondary | ICD-10-CM | POA: Diagnosis not present

## 2012-09-15 DIAGNOSIS — J449 Chronic obstructive pulmonary disease, unspecified: Secondary | ICD-10-CM | POA: Diagnosis not present

## 2012-09-15 DIAGNOSIS — Z0000 Encounter for general adult medical examination without abnormal findings: Secondary | ICD-10-CM | POA: Diagnosis not present

## 2012-09-15 DIAGNOSIS — Z125 Encounter for screening for malignant neoplasm of prostate: Secondary | ICD-10-CM | POA: Diagnosis not present

## 2012-09-15 DIAGNOSIS — I10 Essential (primary) hypertension: Secondary | ICD-10-CM | POA: Diagnosis not present

## 2012-09-15 DIAGNOSIS — J45909 Unspecified asthma, uncomplicated: Secondary | ICD-10-CM | POA: Diagnosis not present

## 2012-09-17 DIAGNOSIS — Z1212 Encounter for screening for malignant neoplasm of rectum: Secondary | ICD-10-CM | POA: Diagnosis not present

## 2012-11-26 DIAGNOSIS — H2510 Age-related nuclear cataract, unspecified eye: Secondary | ICD-10-CM | POA: Diagnosis not present

## 2012-11-26 DIAGNOSIS — H3530 Unspecified macular degeneration: Secondary | ICD-10-CM | POA: Diagnosis not present

## 2012-11-26 DIAGNOSIS — H353190 Nonexudative age-related macular degeneration, unspecified eye, stage unspecified: Secondary | ICD-10-CM | POA: Diagnosis not present

## 2012-11-26 DIAGNOSIS — H43819 Vitreous degeneration, unspecified eye: Secondary | ICD-10-CM | POA: Diagnosis not present

## 2013-03-10 DIAGNOSIS — R972 Elevated prostate specific antigen [PSA]: Secondary | ICD-10-CM | POA: Diagnosis not present

## 2013-03-10 DIAGNOSIS — Z1331 Encounter for screening for depression: Secondary | ICD-10-CM | POA: Diagnosis not present

## 2013-03-10 DIAGNOSIS — I739 Peripheral vascular disease, unspecified: Secondary | ICD-10-CM | POA: Diagnosis not present

## 2013-03-10 DIAGNOSIS — I6529 Occlusion and stenosis of unspecified carotid artery: Secondary | ICD-10-CM | POA: Diagnosis not present

## 2013-03-10 DIAGNOSIS — E785 Hyperlipidemia, unspecified: Secondary | ICD-10-CM | POA: Diagnosis not present

## 2013-03-10 DIAGNOSIS — G609 Hereditary and idiopathic neuropathy, unspecified: Secondary | ICD-10-CM | POA: Diagnosis not present

## 2013-03-10 DIAGNOSIS — Z6825 Body mass index (BMI) 25.0-25.9, adult: Secondary | ICD-10-CM | POA: Diagnosis not present

## 2013-03-10 DIAGNOSIS — J449 Chronic obstructive pulmonary disease, unspecified: Secondary | ICD-10-CM | POA: Diagnosis not present

## 2013-03-10 DIAGNOSIS — I10 Essential (primary) hypertension: Secondary | ICD-10-CM | POA: Diagnosis not present

## 2013-03-11 DIAGNOSIS — R972 Elevated prostate specific antigen [PSA]: Secondary | ICD-10-CM | POA: Diagnosis not present

## 2013-05-13 DIAGNOSIS — Z23 Encounter for immunization: Secondary | ICD-10-CM | POA: Diagnosis not present

## 2013-09-22 DIAGNOSIS — E785 Hyperlipidemia, unspecified: Secondary | ICD-10-CM | POA: Diagnosis not present

## 2013-09-22 DIAGNOSIS — R972 Elevated prostate specific antigen [PSA]: Secondary | ICD-10-CM | POA: Diagnosis not present

## 2013-09-22 DIAGNOSIS — Z125 Encounter for screening for malignant neoplasm of prostate: Secondary | ICD-10-CM | POA: Diagnosis not present

## 2013-09-22 DIAGNOSIS — I10 Essential (primary) hypertension: Secondary | ICD-10-CM | POA: Diagnosis not present

## 2013-09-22 DIAGNOSIS — I739 Peripheral vascular disease, unspecified: Secondary | ICD-10-CM | POA: Diagnosis not present

## 2013-09-29 DIAGNOSIS — R972 Elevated prostate specific antigen [PSA]: Secondary | ICD-10-CM | POA: Diagnosis not present

## 2013-09-29 DIAGNOSIS — J45909 Unspecified asthma, uncomplicated: Secondary | ICD-10-CM | POA: Diagnosis not present

## 2013-09-29 DIAGNOSIS — J449 Chronic obstructive pulmonary disease, unspecified: Secondary | ICD-10-CM | POA: Diagnosis not present

## 2013-09-29 DIAGNOSIS — E785 Hyperlipidemia, unspecified: Secondary | ICD-10-CM | POA: Diagnosis not present

## 2013-09-29 DIAGNOSIS — Z0000 Encounter for general adult medical examination without abnormal findings: Secondary | ICD-10-CM | POA: Diagnosis not present

## 2013-09-29 DIAGNOSIS — I6529 Occlusion and stenosis of unspecified carotid artery: Secondary | ICD-10-CM | POA: Diagnosis not present

## 2013-09-29 DIAGNOSIS — Z125 Encounter for screening for malignant neoplasm of prostate: Secondary | ICD-10-CM | POA: Diagnosis not present

## 2013-09-29 DIAGNOSIS — I10 Essential (primary) hypertension: Secondary | ICD-10-CM | POA: Diagnosis not present

## 2013-09-29 DIAGNOSIS — I739 Peripheral vascular disease, unspecified: Secondary | ICD-10-CM | POA: Diagnosis not present

## 2013-10-01 DIAGNOSIS — Z1212 Encounter for screening for malignant neoplasm of rectum: Secondary | ICD-10-CM | POA: Diagnosis not present

## 2014-04-01 DIAGNOSIS — M545 Low back pain, unspecified: Secondary | ICD-10-CM | POA: Diagnosis not present

## 2014-04-01 DIAGNOSIS — J449 Chronic obstructive pulmonary disease, unspecified: Secondary | ICD-10-CM | POA: Diagnosis not present

## 2014-04-01 DIAGNOSIS — I739 Peripheral vascular disease, unspecified: Secondary | ICD-10-CM | POA: Diagnosis not present

## 2014-04-01 DIAGNOSIS — Z23 Encounter for immunization: Secondary | ICD-10-CM | POA: Diagnosis not present

## 2014-04-01 DIAGNOSIS — Z1331 Encounter for screening for depression: Secondary | ICD-10-CM | POA: Diagnosis not present

## 2014-04-01 DIAGNOSIS — F321 Major depressive disorder, single episode, moderate: Secondary | ICD-10-CM | POA: Diagnosis not present

## 2014-04-01 DIAGNOSIS — E785 Hyperlipidemia, unspecified: Secondary | ICD-10-CM | POA: Diagnosis not present

## 2014-04-01 DIAGNOSIS — J45909 Unspecified asthma, uncomplicated: Secondary | ICD-10-CM | POA: Diagnosis not present

## 2014-04-01 DIAGNOSIS — I10 Essential (primary) hypertension: Secondary | ICD-10-CM | POA: Diagnosis not present

## 2014-04-01 DIAGNOSIS — I6529 Occlusion and stenosis of unspecified carotid artery: Secondary | ICD-10-CM | POA: Diagnosis not present

## 2014-06-02 DIAGNOSIS — Z23 Encounter for immunization: Secondary | ICD-10-CM | POA: Diagnosis not present

## 2014-12-30 DIAGNOSIS — Z125 Encounter for screening for malignant neoplasm of prostate: Secondary | ICD-10-CM | POA: Diagnosis not present

## 2014-12-30 DIAGNOSIS — I10 Essential (primary) hypertension: Secondary | ICD-10-CM | POA: Diagnosis not present

## 2014-12-30 DIAGNOSIS — E785 Hyperlipidemia, unspecified: Secondary | ICD-10-CM | POA: Diagnosis not present

## 2014-12-30 DIAGNOSIS — I739 Peripheral vascular disease, unspecified: Secondary | ICD-10-CM | POA: Diagnosis not present

## 2015-01-06 DIAGNOSIS — I739 Peripheral vascular disease, unspecified: Secondary | ICD-10-CM | POA: Diagnosis not present

## 2015-01-06 DIAGNOSIS — G629 Polyneuropathy, unspecified: Secondary | ICD-10-CM | POA: Diagnosis not present

## 2015-01-06 DIAGNOSIS — M12811 Other specific arthropathies, not elsewhere classified, right shoulder: Secondary | ICD-10-CM | POA: Diagnosis not present

## 2015-01-06 DIAGNOSIS — R809 Proteinuria, unspecified: Secondary | ICD-10-CM | POA: Diagnosis not present

## 2015-01-06 DIAGNOSIS — I6529 Occlusion and stenosis of unspecified carotid artery: Secondary | ICD-10-CM | POA: Diagnosis not present

## 2015-01-06 DIAGNOSIS — I10 Essential (primary) hypertension: Secondary | ICD-10-CM | POA: Diagnosis not present

## 2015-01-06 DIAGNOSIS — J449 Chronic obstructive pulmonary disease, unspecified: Secondary | ICD-10-CM | POA: Diagnosis not present

## 2015-01-06 DIAGNOSIS — Z0000 Encounter for general adult medical examination without abnormal findings: Secondary | ICD-10-CM | POA: Diagnosis not present

## 2015-01-18 DIAGNOSIS — Z1212 Encounter for screening for malignant neoplasm of rectum: Secondary | ICD-10-CM | POA: Diagnosis not present

## 2015-04-26 DIAGNOSIS — M25562 Pain in left knee: Secondary | ICD-10-CM | POA: Diagnosis not present

## 2015-04-26 DIAGNOSIS — M25561 Pain in right knee: Secondary | ICD-10-CM | POA: Diagnosis not present

## 2015-04-26 DIAGNOSIS — M170 Bilateral primary osteoarthritis of knee: Secondary | ICD-10-CM | POA: Diagnosis not present

## 2015-05-01 DIAGNOSIS — M170 Bilateral primary osteoarthritis of knee: Secondary | ICD-10-CM | POA: Diagnosis not present

## 2015-05-01 DIAGNOSIS — M25561 Pain in right knee: Secondary | ICD-10-CM | POA: Diagnosis not present

## 2015-05-01 DIAGNOSIS — M1711 Unilateral primary osteoarthritis, right knee: Secondary | ICD-10-CM | POA: Diagnosis not present

## 2015-05-03 DIAGNOSIS — M25562 Pain in left knee: Secondary | ICD-10-CM | POA: Diagnosis not present

## 2015-05-03 DIAGNOSIS — M1712 Unilateral primary osteoarthritis, left knee: Secondary | ICD-10-CM | POA: Diagnosis not present

## 2015-05-07 DIAGNOSIS — Z23 Encounter for immunization: Secondary | ICD-10-CM | POA: Diagnosis not present

## 2015-05-08 DIAGNOSIS — M1711 Unilateral primary osteoarthritis, right knee: Secondary | ICD-10-CM | POA: Diagnosis not present

## 2015-05-08 DIAGNOSIS — M25561 Pain in right knee: Secondary | ICD-10-CM | POA: Diagnosis not present

## 2015-05-10 DIAGNOSIS — M1712 Unilateral primary osteoarthritis, left knee: Secondary | ICD-10-CM | POA: Diagnosis not present

## 2015-05-10 DIAGNOSIS — M25562 Pain in left knee: Secondary | ICD-10-CM | POA: Diagnosis not present

## 2015-05-15 DIAGNOSIS — M1711 Unilateral primary osteoarthritis, right knee: Secondary | ICD-10-CM | POA: Diagnosis not present

## 2015-05-15 DIAGNOSIS — M25561 Pain in right knee: Secondary | ICD-10-CM | POA: Diagnosis not present

## 2015-05-17 DIAGNOSIS — M1712 Unilateral primary osteoarthritis, left knee: Secondary | ICD-10-CM | POA: Diagnosis not present

## 2015-05-17 DIAGNOSIS — M25562 Pain in left knee: Secondary | ICD-10-CM | POA: Diagnosis not present

## 2015-05-23 DIAGNOSIS — M1711 Unilateral primary osteoarthritis, right knee: Secondary | ICD-10-CM | POA: Diagnosis not present

## 2015-05-23 DIAGNOSIS — M25561 Pain in right knee: Secondary | ICD-10-CM | POA: Diagnosis not present

## 2015-05-24 DIAGNOSIS — M25562 Pain in left knee: Secondary | ICD-10-CM | POA: Diagnosis not present

## 2015-05-24 DIAGNOSIS — M1712 Unilateral primary osteoarthritis, left knee: Secondary | ICD-10-CM | POA: Diagnosis not present

## 2015-09-05 DIAGNOSIS — M25519 Pain in unspecified shoulder: Secondary | ICD-10-CM | POA: Diagnosis not present

## 2015-09-05 DIAGNOSIS — M25511 Pain in right shoulder: Secondary | ICD-10-CM | POA: Diagnosis not present

## 2015-09-05 DIAGNOSIS — M25512 Pain in left shoulder: Secondary | ICD-10-CM | POA: Diagnosis not present

## 2015-09-05 DIAGNOSIS — M170 Bilateral primary osteoarthritis of knee: Secondary | ICD-10-CM | POA: Diagnosis not present

## 2015-09-05 DIAGNOSIS — M25562 Pain in left knee: Secondary | ICD-10-CM | POA: Diagnosis not present

## 2015-09-05 DIAGNOSIS — M25561 Pain in right knee: Secondary | ICD-10-CM | POA: Diagnosis not present

## 2015-09-11 DIAGNOSIS — M545 Low back pain: Secondary | ICD-10-CM | POA: Diagnosis not present

## 2015-09-11 DIAGNOSIS — I129 Hypertensive chronic kidney disease with stage 1 through stage 4 chronic kidney disease, or unspecified chronic kidney disease: Secondary | ICD-10-CM | POA: Diagnosis not present

## 2015-09-11 DIAGNOSIS — I6529 Occlusion and stenosis of unspecified carotid artery: Secondary | ICD-10-CM | POA: Diagnosis not present

## 2015-09-11 DIAGNOSIS — J449 Chronic obstructive pulmonary disease, unspecified: Secondary | ICD-10-CM | POA: Diagnosis not present

## 2015-09-11 DIAGNOSIS — I10 Essential (primary) hypertension: Secondary | ICD-10-CM | POA: Diagnosis not present

## 2015-09-11 DIAGNOSIS — E7800 Pure hypercholesterolemia, unspecified: Secondary | ICD-10-CM | POA: Diagnosis not present

## 2015-09-11 DIAGNOSIS — R195 Other fecal abnormalities: Secondary | ICD-10-CM | POA: Diagnosis not present

## 2015-09-11 DIAGNOSIS — J4520 Mild intermittent asthma, uncomplicated: Secondary | ICD-10-CM | POA: Diagnosis not present

## 2015-09-11 DIAGNOSIS — Z1389 Encounter for screening for other disorder: Secondary | ICD-10-CM | POA: Diagnosis not present

## 2015-09-22 ENCOUNTER — Emergency Department (HOSPITAL_COMMUNITY): Payer: Medicare Other

## 2015-09-22 ENCOUNTER — Encounter (HOSPITAL_COMMUNITY): Payer: Self-pay | Admitting: Family Medicine

## 2015-09-22 ENCOUNTER — Emergency Department (HOSPITAL_COMMUNITY)
Admission: EM | Admit: 2015-09-22 | Discharge: 2015-09-22 | Disposition: A | Discharge status: Home / Self Care | Payer: Medicare Other | Attending: Emergency Medicine | Admitting: Emergency Medicine

## 2015-09-22 DIAGNOSIS — Z7982 Long term (current) use of aspirin: Secondary | ICD-10-CM | POA: Insufficient documentation

## 2015-09-22 DIAGNOSIS — S199XXA Unspecified injury of neck, initial encounter: Secondary | ICD-10-CM | POA: Diagnosis not present

## 2015-09-22 DIAGNOSIS — W01198A Fall on same level from slipping, tripping and stumbling with subsequent striking against other object, initial encounter: Secondary | ICD-10-CM | POA: Insufficient documentation

## 2015-09-22 DIAGNOSIS — S01111A Laceration without foreign body of right eyelid and periocular area, initial encounter: Secondary | ICD-10-CM | POA: Diagnosis not present

## 2015-09-22 DIAGNOSIS — Y9389 Activity, other specified: Secondary | ICD-10-CM | POA: Insufficient documentation

## 2015-09-22 DIAGNOSIS — S0181XA Laceration without foreign body of other part of head, initial encounter: Secondary | ICD-10-CM | POA: Insufficient documentation

## 2015-09-22 DIAGNOSIS — Y998 Other external cause status: Secondary | ICD-10-CM | POA: Insufficient documentation

## 2015-09-22 DIAGNOSIS — Z87891 Personal history of nicotine dependence: Secondary | ICD-10-CM | POA: Insufficient documentation

## 2015-09-22 DIAGNOSIS — Y9289 Other specified places as the place of occurrence of the external cause: Secondary | ICD-10-CM | POA: Diagnosis not present

## 2015-09-22 DIAGNOSIS — Z79899 Other long term (current) drug therapy: Secondary | ICD-10-CM | POA: Diagnosis not present

## 2015-09-22 DIAGNOSIS — I10 Essential (primary) hypertension: Secondary | ICD-10-CM | POA: Diagnosis not present

## 2015-09-22 DIAGNOSIS — Z8719 Personal history of other diseases of the digestive system: Secondary | ICD-10-CM | POA: Insufficient documentation

## 2015-09-22 DIAGNOSIS — Z792 Long term (current) use of antibiotics: Secondary | ICD-10-CM | POA: Insufficient documentation

## 2015-09-22 DIAGNOSIS — S0191XA Laceration without foreign body of unspecified part of head, initial encounter: Secondary | ICD-10-CM

## 2015-09-22 DIAGNOSIS — Z791 Long term (current) use of non-steroidal anti-inflammatories (NSAID): Secondary | ICD-10-CM | POA: Insufficient documentation

## 2015-09-22 DIAGNOSIS — S0990XA Unspecified injury of head, initial encounter: Secondary | ICD-10-CM | POA: Diagnosis present

## 2015-09-22 DIAGNOSIS — J45909 Unspecified asthma, uncomplicated: Secondary | ICD-10-CM | POA: Insufficient documentation

## 2015-09-22 DIAGNOSIS — W19XXXA Unspecified fall, initial encounter: Secondary | ICD-10-CM

## 2015-09-22 NOTE — ED Notes (Signed)
PA at the bedside.

## 2015-09-22 NOTE — ED Notes (Signed)
PA stitched patient up.

## 2015-09-22 NOTE — Discharge Instructions (Signed)
Keep wound dry and do not remove dressing for 24 hours if possible. After that, wash gently morning and night (every 12 hours) with soap and water. Use a topical antibiotic ointment and cover with a bandaid or gauze.    Do NOT use rubbing alcohol or hydrogen peroxide, do not soak the area   Present to your primary care doctor or the urgent care of your choice, or the ED for suture removal in 7-10 days.   Every attempt was made to remove foreign body (contaminants) from the wound.  However, there is always a chance that some may remain in the wound. This can  increase your risk of infection.   If you see signs of infection (warmth, redness, tenderness, pus, sharp increase in pain, fever, red streaking in the skin) immediately return to the emergency department.   After the wound heals fully, apply sunscreen for 6-12 months to minimize scarring.    Head Injury, Adult You have a head injury. Headaches and throwing up (vomiting) are common after a head injury. It should be easy to wake up from sleeping. Sometimes you must stay in the hospital. Most problems happen within the first 24 hours. Side effects may occur up to 7-10 days after the injury.  WHAT ARE THE TYPES OF HEAD INJURIES? Head injuries can be as minor as a bump. Some head injuries can be more severe. More severe head injuries include:  A jarring injury to the brain (concussion).  A bruise of the brain (contusion). This mean there is bleeding in the brain that can cause swelling.  A cracked skull (skull fracture).  Bleeding in the brain that collects, clots, and forms a bump (hematoma). WHEN SHOULD I GET HELP RIGHT AWAY?   You are confused or sleepy.  You cannot be woken up.  You feel sick to your stomach (nauseous) or keep throwing up (vomiting).  Your dizziness or unsteadiness is getting worse.  You have very bad, lasting headaches that are not helped by medicine. Take medicines only as told by your doctor.  You cannot  use your arms or legs like normal.  You cannot walk.  You notice changes in the black spots in the center of the colored part of your eye (pupil).  You have clear or bloody fluid coming from your nose or ears.  You have trouble seeing. During the next 24 hours after the injury, you must stay with someone who can watch you. This person should get help right away (call 911 in the [place] if you start to shake and are not able to control it (have seizures), you pass out, or you are unable to wake up. HOW CAN I PREVENT A HEAD INJURY IN THE FUTURE?  Wear seat belts.  Wear a helmet while bike riding and playing sports like football.  Stay away from dangerous activities around the house. WHEN CAN I RETURN TO NORMAL ACTIVITIES AND ATHLETICS? See your doctor before doing these activities. You should not do normal activities or play contact sports until 1 week after the following symptoms have stopped:  Headache that does not go away.  Dizziness.  Poor attention.  Confusion.  Memory problems.  Sickness to your stomach or throwing up.  Tiredness.  Fussiness.  Bothered by bright lights or loud noises.  Anxiousness or depression.  Restless sleep. MAKE SURE YOU:   Understand these instructions.  Will watch your condition.  Will get help right away if you are not doing well or get worse.  This information is not intended to replace advice given to you by your health care provider. Make sure you discuss any questions you have with your health care provider.   Document Released: 07/18/2008 Document Revised: 08/26/2014 Document Reviewed: 04/12/2013 Elsevier Interactive Patient Education [employer].

## 2015-09-22 NOTE — ED Provider Notes (Signed)
CSN: [id number]     Arrival date & time 09/22/15  1424 History  By signing my name below, I, Freida Busman, attest that this documentation has been prepared under the direction and in the presence of non-physician practitioner, Wynetta Emery, PA-C. Electronically Signed: Freida Busman, Scribe. 09/22/2015. 5:22 PM.    Chief Complaint  Patient presents with  . Fall  . Head Laceration    The history is provided by the patient. No language interpreter was used.     HPI Comments:  JASIM HARARI is a 82 y.o. male who presents to the Emergency Department s/p fall this afternoon complaining of a laceration to his right forward following the incident with minimal surrounding pain at the site. Pt tripped and  fell forward stricking his head on the concrete ground. He denies LOC. He has no other injuries at this time. Pt is ambulating as he usually would. He is not currently on any blood thinners and  his tetanus is UTD within the last 10 years. He denies change in vision, HA, nausea, CP, and vomiting.  No alleviating factors noted.   Past Medical History  Diagnosis Date  . HTN (hypertension)   . Dizziness   . Diverticulosis of colon   . Asthma   . PAD (peripheral artery disease) (HCC)     severe bilateral lower ext disease. Runoff 1/12 per Cr. Chen with severe disease R SFA and below the knee   Past Surgical History  Procedure Laterality Date  . Right carotic endarentomy  2005  . Exploration debridement      repair of Achilles Tendon repture, right  . Appendectomy    . Tonsillectomy    . Perforated large intestines     Family History  Problem Relation Age of Onset  . Liver disease Brother    Social History  Substance Use Topics  . Smoking status: Former Games developer  . Smokeless tobacco: None     Comment: quit 30 years ago  . Alcohol Use: No    Review of Systems  10 systems reviewed and all are negative for acute change except as noted in the HPI.   Allergies  Review of  patient's allergies indicates no known allergies.  Home Medications   Prior to Admission medications   Medication Sig Start Date End Date Taking? Authorizing Provider  albuterol (PROAIR HFA) 108 (90 BASE) MCG/ACT inhaler as directed.      Historical Provider, MD  ALPRAZolam Prudy Feeler) 0.5 MG tablet Take 0.25 mg by mouth at bedtime as needed.      Historical Provider, MD  amLODipine (NORVASC) 10 MG tablet Take 10 mg by mouth daily.      Historical Provider, MD  aspirin 81 MG tablet Take 81 mg by mouth daily.      Historical Provider, MD  celecoxib (CELEBREX) 200 MG capsule Take 200 mg by mouth daily.      Historical Provider, MD  cephALEXin (KEFLEX) 500 MG capsule Take 500 mg by mouth 4 (four) times daily.      Historical Provider, MD  fluticasone (FLONASE) 50 MCG/ACT nasal spray as needed.      Historical Provider, MD  Multiple Vitamin (MULTIVITAMIN PO) Take 1 tablet by mouth daily.      Historical Provider, MD  Multiple Vitamins-Minerals (PRESERVISION AREDS) CAPS Take 2 capsules by mouth daily.      Historical Provider, MD  tiotropium (SPIRIVA HANDIHALER) 18 MCG inhalation capsule Place 18 mcg into inhaler and inhale daily.  Historical Provider, MD   BP 139/62 mmHg  Pulse 74  Temp(Src) 98.2 F (36.8 C) (Oral)  Resp 18  SpO2 98% Physical Exam  Constitutional: He is oriented to person, place, and time. He appears well-developed and well-nourished. No distress.  HENT:  Head: Normocephalic.  Mouth/Throat: Oropharynx is clear and moist.  3 cm full thickness jagged lacercation over right eyebrow  No hemotympanum, battle signs or raccoon's eyes  No crepitance or tenderness to palpation along the orbital rim.  EOMI intact with no pain or diplopia  No abnormal otorrhea or rhinorrhea. Nasal septum midline.  No intraoral trauma.  Eyes: Conjunctivae and EOM are normal. Pupils are equal, round, and reactive to light.  Neck: Normal range of motion. Neck supple.  No midline C-spine   tenderness to palpation or step-offs appreciated. Patient has full range of motion without pain.  Grip/bicep/tricep strength 5/5 bilaterally. Able to differentiate between pinprick and light touch bilaterally     Cardiovascular: Normal rate, regular rhythm and intact distal pulses.   Pulmonary/Chest: Effort normal and breath sounds normal. No respiratory distress. He has no wheezes. He has no rales. He exhibits no tenderness.  No TTP or crepitance  Abdominal: Soft. Bowel sounds are normal. He exhibits no distension and no mass. There is no tenderness. There is no rebound and no guarding.  Musculoskeletal: Normal range of motion. He exhibits no edema or tenderness.  Pelvis stable, No TTP of greater trochanter bilaterally  No tenderness to percussion of Lumbar/Thoracic spinous processes. No step-offs. No paraspinal muscular TTP  Neurological: He is alert and oriented to person, place, and time.  Strength 5/5 x4 extremities   Distal sensation intact  Skin: Skin is warm and dry.  Psychiatric: He has a normal mood and affect.  Nursing note and vitals reviewed.   ED Course  .Marland KitchenLaceration Repair Date/Time: 09/22/2015 6:07 PM Performed by: Wynetta Emery Authorized by: Wynetta Emery Consent: Verbal consent obtained. Risks and benefits: risks, benefits and alternatives were discussed Consent given by: patient Patient identity confirmed: verbally with patient Body area: head/neck Location details: forehead Laceration length: 2 cm Contamination: The wound is contaminated. Foreign bodies: no foreign bodies Anesthesia: local infiltration Local anesthetic: lidocaine 1% without epinephrine Anesthetic total: 3 ml Preparation: Patient was prepped and draped in the usual sterile fashion. Irrigation solution: saline Irrigation method: syringe Amount of cleaning: standard Skin closure: Ethilon (5-0) Mucous membrane closure: 6-0 Chromic gut Number of sutures: 4 Technique:  simple Approximation: close Approximation difficulty: simple Dressing: antibiotic ointment Patient tolerance: Patient tolerated the procedure well with no immediate complications     DIAGNOSTIC STUDIES:  Oxygen Saturation is 98% on RA, normal by my interpretation.    COORDINATION OF CARE:  5:19 PM Pt updated with results. Will repair laceration. Discussed treatment plan with pt at bedside and pt agreed to plan.    Labs Review Labs Reviewed - No data to display  Imaging Review Ct Head Wo Contrast  09/22/2015  CLINICAL DATA:  Head laceration after fall today on concrete. No reported loss of consciousness. EXAM: CT HEAD WITHOUT CONTRAST CT CERVICAL SPINE WITHOUT CONTRAST TECHNIQUE: Multidetector CT imaging of the head and cervical spine was performed following the standard protocol without intravenous contrast. Multiplanar CT image reconstructions of the cervical spine were also generated. COMPARISON:  None. FINDINGS: CT HEAD FINDINGS Bony calvarium appears intact. Mild diffuse cortical atrophy is noted. Mild chronic ischemic white matter disease is noted. No mass effect or midline shift is noted. Ventricular size is within  normal limits. There is no evidence of mass lesion, hemorrhage or acute infarction. CT CERVICAL SPINE FINDINGS No fracture or spondylolisthesis is noted. Mild anterior osteophyte formation is noted at C3-4 and C5-6. Extensive degenerative changes seen involving the left-sided posterior facet joints. Visualized lung fields appear normal. IMPRESSION: Mild diffuse cortical atrophy. Mild chronic ischemic white matter disease. No acute intracranial abnormality seen. Degenerative changes are noted throughout the cervical spine as described above. No fracture or spondylolisthesis is noted. Electronically Signed   By: Lupita Raider, M.D.   On: 09/22/2015 16:20   Ct Cervical Spine Wo Contrast  09/22/2015  CLINICAL DATA:  Head laceration after fall today on concrete. No reported loss  of consciousness. EXAM: CT HEAD WITHOUT CONTRAST CT CERVICAL SPINE WITHOUT CONTRAST TECHNIQUE: Multidetector CT imaging of the head and cervical spine was performed following the standard protocol without intravenous contrast. Multiplanar CT image reconstructions of the cervical spine were also generated. COMPARISON:  None. FINDINGS: CT HEAD FINDINGS Bony calvarium appears intact. Mild diffuse cortical atrophy is noted. Mild chronic ischemic white matter disease is noted. No mass effect or midline shift is noted. Ventricular size is within normal limits. There is no evidence of mass lesion, hemorrhage or acute infarction. CT CERVICAL SPINE FINDINGS No fracture or spondylolisthesis is noted. Mild anterior osteophyte formation is noted at C3-4 and C5-6. Extensive degenerative changes seen involving the left-sided posterior facet joints. Visualized lung fields appear normal. IMPRESSION: Mild diffuse cortical atrophy. Mild chronic ischemic white matter disease. No acute intracranial abnormality seen. Degenerative changes are noted throughout the cervical spine as described above. No fracture or spondylolisthesis is noted. Electronically Signed   By: Lupita Raider, M.D.   On: 09/22/2015 16:20   I have personally reviewed and evaluated these images as part of my medical decision-making.    MDM   Final diagnoses:  Laceration of head, initial encounter  Fall, initial encounter    Filed Vitals:   09/22/15 1439 09/22/15 1759  BP: 139/62 151/67  Pulse: 74 79  Temp: 98.2 F (36.8 C) 97.5 F (36.4 C)  TempSrc: Oral Oral  Resp: 18 16  SpO2: 98% 94%    ERIE RADU is 82 y.o. male presenting with head trauma and laceration status post fall. Neuro exam is nonfocal, this does not appear to be a syncope and closed and head CT negative. Discussed head trauma precautions.  This is a shared visit with the attending physician who personally evaluated the patient and agrees with the care plan.    Evaluation does not show pathology that would require ongoing emergent intervention or inpatient treatment. Pt is hemodynamically stable and mentating appropriately. Discussed findings and plan with patient/guardian, who agrees with care plan. All questions answered. Return precautions discussed and outpatient follow up given.    I personally performed the services described in this documentation, which was scribed in my presence. The recorded information has been reviewed and is accurate.   Wynetta Emery, PA-C 09/22/15 2111  Arby Barrette, MD 09/23/15 2352

## 2015-09-22 NOTE — ED Notes (Signed)
Pt here for trip and fall striking head on the concrete. Denies any dizziness, nausea, vision problems or LOC.

## 2015-09-29 ENCOUNTER — Encounter (HOSPITAL_COMMUNITY): Payer: Self-pay | Admitting: Emergency Medicine

## 2015-09-29 ENCOUNTER — Emergency Department (HOSPITAL_COMMUNITY)
Admission: EM | Admit: 2015-09-29 | Discharge: 2015-09-29 | Disposition: A | Discharge status: Home / Self Care | Payer: Medicare Other | Attending: Emergency Medicine | Admitting: Emergency Medicine

## 2015-09-29 DIAGNOSIS — Z87891 Personal history of nicotine dependence: Secondary | ICD-10-CM | POA: Diagnosis not present

## 2015-09-29 DIAGNOSIS — Z4802 Encounter for removal of sutures: Secondary | ICD-10-CM | POA: Insufficient documentation

## 2015-09-29 DIAGNOSIS — Z7982 Long term (current) use of aspirin: Secondary | ICD-10-CM | POA: Insufficient documentation

## 2015-09-29 DIAGNOSIS — I10 Essential (primary) hypertension: Secondary | ICD-10-CM | POA: Diagnosis not present

## 2015-09-29 DIAGNOSIS — Z792 Long term (current) use of antibiotics: Secondary | ICD-10-CM | POA: Diagnosis not present

## 2015-09-29 DIAGNOSIS — J45909 Unspecified asthma, uncomplicated: Secondary | ICD-10-CM | POA: Diagnosis not present

## 2015-09-29 DIAGNOSIS — Z8719 Personal history of other diseases of the digestive system: Secondary | ICD-10-CM | POA: Insufficient documentation

## 2015-09-29 DIAGNOSIS — Z79899 Other long term (current) drug therapy: Secondary | ICD-10-CM | POA: Diagnosis not present

## 2015-09-29 NOTE — Discharge Instructions (Signed)

## 2015-09-29 NOTE — ED Provider Notes (Signed)
CSN: [id number]     Arrival date & time 09/29/15  1236 History  By signing my name below, I, [name] [name], attest that this documentation has been prepared under the direction and in the presence of [name] Horseman, PA-C. Electronically Signed: Tanda [name], ED Scribe. 09/29/2015. 1:30 PM.   Chief Complaint  Patient presents with  . Suture / Staple Removal   The history is provided by the patient. No language interpreter was used.    HPI Comments: [name] [name] is a 82 y.o. male who presents to the Emergency Department for suture removal. Pt was seen in the ED on 09/22/2015 (approximately 1 week ago) for laceration above his right eyebrow s/p ground level fall. He reports that he was walking [place] when he tripped and fell, landing onto the right side of his face. Pt had 4 sutures placed and was told to come to the ED in 1 week for suture removal. He states that the wound has been healing well and that he feels good. Denies pain, drainage, fever, chills, or any other associated symptoms. Pt is not on any anticoagulants.   Past Medical History  Diagnosis Date  . HTN (hypertension)   . Dizziness   . Diverticulosis of colon   . Asthma   . PAD (peripheral artery disease) (HCC)     severe bilateral lower ext disease. Runoff 1/12 per Cr. Chen with severe disease R SFA and below the knee   Past Surgical History  Procedure Laterality Date  . Right carotic endarentomy  2005  . Exploration debridement      repair of Achilles Tendon repture, right  . Appendectomy    . Tonsillectomy    . Perforated large intestines     Family History  Problem Relation Age of Onset  . Liver disease Brother    Social History  Substance Use Topics  . Smoking status: Former Games developer  . Smokeless tobacco: None     Comment: quit 30 years ago  . Alcohol Use: No    Review of Systems  Constitutional: Negative for fever and chills.  Musculoskeletal: Negative for arthralgias.  Skin:  Positive for wound (Sutured laceration above right eyebrow).      Allergies  Review of patient's allergies indicates no known allergies.  Home Medications   Prior to Admission medications   Medication Sig Start Date End Date Taking? Authorizing Provider  albuterol (PROAIR HFA) 108 (90 BASE) MCG/ACT inhaler as directed.      Historical Provider, MD  ALPRAZolam Prudy Feeler) 0.5 MG tablet Take 0.25 mg by mouth at bedtime as needed.      Historical Provider, MD  amLODipine (NORVASC) 10 MG tablet Take 10 mg by mouth daily.      Historical Provider, MD  aspirin 81 MG tablet Take 81 mg by mouth daily.      Historical Provider, MD  celecoxib (CELEBREX) 200 MG capsule Take 200 mg by mouth daily.      Historical Provider, MD  cephALEXin (KEFLEX) 500 MG capsule Take 500 mg by mouth 4 (four) times daily.      Historical Provider, MD  fluticasone (FLONASE) 50 MCG/ACT nasal spray as needed.      Historical Provider, MD  Multiple Vitamin (MULTIVITAMIN PO) Take 1 tablet by mouth daily.      Historical Provider, MD  Multiple Vitamins-Minerals (PRESERVISION AREDS) CAPS Take 2 capsules by mouth daily.      Historical Provider, MD  tiotropium (SPIRIVA HANDIHALER) 18 MCG  inhalation capsule Place 18 mcg into inhaler and inhale daily.      Historical Provider, MD   BP 128/60 mmHg  Pulse 72  Temp(Src) 97.5 F (36.4 C) (Oral)  Resp 18  SpO2 97%   Physical Exam  Constitutional: He is oriented to person, place, and time. He appears well-developed and well-nourished. No distress.  HENT:  Head: Normocephalic.  Eyes: Conjunctivae and EOM are normal.  Neck: Neck supple. No tracheal deviation present.  Cardiovascular: Normal rate.   Pulmonary/Chest: Effort normal. No respiratory distress.  Musculoskeletal: Normal range of motion.  Neurological: He is alert and oriented to person, place, and time.  Skin: Skin is warm and dry.  Well healing right eyebrow laceration Sutures intact  Psychiatric: He has a normal  mood and affect. His behavior is normal.  Nursing note and vitals reviewed.   ED Course  Procedures (including critical care time)  SUTURE REMOVAL Performed by: [name] Horseman, PA-C Authorized by: [name] Horseman, PA-C Consent: Verbal consent obtained. Consent given by: patient Required items: required blood products, implants, devices, and special equipment available  Time out: Immediately prior to procedure a "time out" was called to verify the correct patient, procedure, equipment, support staff and site/side marked as required. Location: right eyebrow Wound Appearance: well healing Sutures Removed: 4 Patient tolerance: Patient tolerated the procedure well with no immediate complications.    DIAGNOSTIC STUDIES: Oxygen Saturation is 97% on RA, normal by my interpretation.    COORDINATION OF CARE: 1:25 PM-Discussed treatment plan which includes suture removal with pt at bedside and pt agreed to plan.   MDM   Final diagnoses:  Visit for suture removal    I personally performed the services described in this documentation, which was scribed in my presence. The recorded information has been reviewed and is accurate.        [name] Horseman, PA-C 09/29/15 1357  [name] Memos, MD 09/29/15 [phone]

## 2015-09-29 NOTE — ED Notes (Signed)
Pt here for suture removal

## 2016-05-23 DIAGNOSIS — M25562 Pain in left knee: Secondary | ICD-10-CM | POA: Diagnosis not present

## 2016-05-23 DIAGNOSIS — M170 Bilateral primary osteoarthritis of knee: Secondary | ICD-10-CM | POA: Diagnosis not present

## 2016-05-23 DIAGNOSIS — M25561 Pain in right knee: Secondary | ICD-10-CM | POA: Diagnosis not present

## 2016-05-23 DIAGNOSIS — R262 Difficulty in walking, not elsewhere classified: Secondary | ICD-10-CM | POA: Diagnosis not present

## 2016-06-06 DIAGNOSIS — M25562 Pain in left knee: Secondary | ICD-10-CM | POA: Diagnosis not present

## 2016-06-06 DIAGNOSIS — M170 Bilateral primary osteoarthritis of knee: Secondary | ICD-10-CM | POA: Diagnosis not present

## 2016-06-06 DIAGNOSIS — M25561 Pain in right knee: Secondary | ICD-10-CM | POA: Diagnosis not present

## 2016-06-12 DIAGNOSIS — M25561 Pain in right knee: Secondary | ICD-10-CM | POA: Diagnosis not present

## 2016-06-12 DIAGNOSIS — M25562 Pain in left knee: Secondary | ICD-10-CM | POA: Diagnosis not present

## 2016-06-12 DIAGNOSIS — M170 Bilateral primary osteoarthritis of knee: Secondary | ICD-10-CM | POA: Diagnosis not present

## 2016-06-20 DIAGNOSIS — M25561 Pain in right knee: Secondary | ICD-10-CM | POA: Diagnosis not present

## 2016-06-20 DIAGNOSIS — M25562 Pain in left knee: Secondary | ICD-10-CM | POA: Diagnosis not present

## 2016-06-20 DIAGNOSIS — M170 Bilateral primary osteoarthritis of knee: Secondary | ICD-10-CM | POA: Diagnosis not present

## 2016-06-27 DIAGNOSIS — Z23 Encounter for immunization: Secondary | ICD-10-CM | POA: Diagnosis not present

## 2016-07-02 DIAGNOSIS — M170 Bilateral primary osteoarthritis of knee: Secondary | ICD-10-CM | POA: Diagnosis not present

## 2016-07-02 DIAGNOSIS — M25562 Pain in left knee: Secondary | ICD-10-CM | POA: Diagnosis not present

## 2016-07-02 DIAGNOSIS — M25561 Pain in right knee: Secondary | ICD-10-CM | POA: Diagnosis not present

## 2016-08-30 DIAGNOSIS — E46 Unspecified protein-calorie malnutrition: Secondary | ICD-10-CM | POA: Diagnosis not present

## 2016-08-30 DIAGNOSIS — I129 Hypertensive chronic kidney disease with stage 1 through stage 4 chronic kidney disease, or unspecified chronic kidney disease: Secondary | ICD-10-CM | POA: Diagnosis not present

## 2016-08-30 DIAGNOSIS — M1990 Unspecified osteoarthritis, unspecified site: Secondary | ICD-10-CM | POA: Diagnosis not present

## 2016-08-30 DIAGNOSIS — F321 Major depressive disorder, single episode, moderate: Secondary | ICD-10-CM | POA: Diagnosis not present

## 2016-08-30 DIAGNOSIS — Z6822 Body mass index (BMI) 22.0-22.9, adult: Secondary | ICD-10-CM | POA: Diagnosis not present

## 2016-10-15 ENCOUNTER — Ambulatory Visit (INDEPENDENT_AMBULATORY_CARE_PROVIDER_SITE_OTHER): Payer: Medicare Other

## 2016-10-15 ENCOUNTER — Encounter (INDEPENDENT_AMBULATORY_CARE_PROVIDER_SITE_OTHER): Payer: Self-pay | Admitting: Orthopaedic Surgery

## 2016-10-15 ENCOUNTER — Ambulatory Visit (INDEPENDENT_AMBULATORY_CARE_PROVIDER_SITE_OTHER): Payer: Medicare Other | Admitting: Orthopaedic Surgery

## 2016-10-15 DIAGNOSIS — M25561 Pain in right knee: Secondary | ICD-10-CM | POA: Diagnosis not present

## 2016-10-15 DIAGNOSIS — M25562 Pain in left knee: Secondary | ICD-10-CM

## 2016-10-15 DIAGNOSIS — M1712 Unilateral primary osteoarthritis, left knee: Secondary | ICD-10-CM

## 2016-10-15 DIAGNOSIS — G8929 Other chronic pain: Secondary | ICD-10-CM

## 2016-10-15 DIAGNOSIS — M25572 Pain in left ankle and joints of left foot: Secondary | ICD-10-CM

## 2016-10-15 DIAGNOSIS — M1711 Unilateral primary osteoarthritis, right knee: Secondary | ICD-10-CM

## 2016-10-15 DIAGNOSIS — M25571 Pain in right ankle and joints of right foot: Secondary | ICD-10-CM | POA: Diagnosis not present

## 2016-10-15 DIAGNOSIS — M25552 Pain in left hip: Secondary | ICD-10-CM

## 2016-10-15 DIAGNOSIS — M25551 Pain in right hip: Secondary | ICD-10-CM

## 2016-10-15 NOTE — Progress Notes (Signed)
Office Visit Note   Patient: [name] [name]           Date of Birth: November 05, 1923           MRN: [id number] Visit Date: 10/15/2016              Requested by: Gaspar Garbeichard W Tisovec, MD [address] [place], [place] [id number] PCP: Gaspar Garbeichard W Tisovec, MD   Assessment & Plan: Visit Diagnoses:  1. Bilateral hip pain   2. Chronic pain of both knees   3. Chronic pain of both ankles   4. Unilateral primary osteoarthritis, right knee   5. Unilateral primary osteoarthritis, left knee     Plan: Patient had degenerative joint disease of both hips knees and ankles. I offered a cortisone injection of his knees today but he declined. We discussed other options including over-the-counter medicines and Visco supplementation and knee replacement. He is not interested in any of these treatment options. I'll see him back as needed.  Follow-Up Instructions: Return if symptoms worsen or fail to improve.   Orders:  Orders Placed This Encounter  Procedures  . XR Pelvis 1-2 Views  . XR Knee 1-2 Views Right  . XR Knee 1-2 Views Left  . XR Ankle 2 Views Right  . XR Ankle 2 Views Left   No orders of the defined types were placed in this encounter.     Procedures: No procedures performed   Clinical Data: No additional findings.   Subjective: Chief Complaint  Patient presents with  . Right Knee - Pain  . Left Knee - Pain  . Right Ankle - Pain  . Left Ankle - Pain  . Right Hip - Pain  . Left Hip - Pain    Patient is a 83 year old gentleman who comes in with bilateral lower extremity pain from his hips down to his ankles.[name] [name]. He did go to [facility] and got injections which did not give him any relief. He complains of cracking and popping. He has difficulty walking. He has pain in his shoulders and has trouble reaching up. Pain does not radiate.    Review of Systems  Constitutional: Negative.   All other systems reviewed and are negative.    Objective: Vital Signs: There were no  vitals taken for this visit.  Physical Exam  Constitutional: He is oriented to person, place, and time. He appears well-developed and well-nourished.  HENT:  Head: Normocephalic and atraumatic.  Eyes: Pupils are equal, round, and reactive to light.  Neck: Neck supple.  Pulmonary/Chest: Effort normal.  Abdominal: Soft.  Musculoskeletal: Normal range of motion.  Neurological: He is alert and oriented to person, place, and time.  Skin: Skin is warm.  Psychiatric: He has a normal mood and affect. His behavior is normal. Judgment and thought content normal.  Nursing note and vitals reviewed.   Ortho Exam Exam of bilateral hip shows pain with range of motion. Exam of bilateral knee shows crepitus with range of motion. Closed cruciate's are stable. No joint line tenderness. No joint effusion. Exam of bilateral ankle shows good range of motion with mild pain. No swelling no effusion. Specialty Comments:  No specialty comments available.  Imaging: Xr Knee 1-2 Views Left  Result Date: 10/15/2016 Advanced degenerative joint disease  Xr Ankle 2 Views Left  Result Date: 10/15/2016 Mild osteoarthritis  Xr Ankle 2 Views Right  Result Date: 10/15/2016 Mild osteoarthritis  Xr Knee 1-2 Views Right  Result Date: 10/15/2016 Advanced degenerative joint disease  PMFS History: Patient Active Problem List   Diagnosis Date Noted  . TOBACCO ABUSE 09/21/2010  . CELLULITIS AND ABSCESS OF LEG EXCEPT FOOT 09/11/2010  . PVD 07/04/2010  . HYPERCHOLESTEROLEMIA 07/03/2010  . HYPERTENSION 07/03/2010  . ASTHMA 07/03/2010  . COPD 07/03/2010  . DIZZINESS 07/03/2010  . CHEST DISCOMFORT 07/03/2010  . DIVERTICULOSIS, COLON, HX OF 07/03/2010  . ATHEROSLERO NATIVE ART EXTREMITIES W/ULCERATION 06/22/2010   Past Medical History:  Diagnosis Date  . Asthma   . Diverticulosis of colon   . Dizziness   . HTN (hypertension)   . PAD (peripheral artery disease) (HCC)    severe bilateral lower ext  disease. Runoff 1/12 per Cr. Chen with severe disease R SFA and below the knee    Family History  Problem Relation Age of Onset  . Liver disease Brother     Past Surgical History:  Procedure Laterality Date  . APPENDECTOMY    . exploration debridement     repair of Achilles Tendon repture, right  . perforated large intestines    . right carotic endarentomy  2005  . TONSILLECTOMY     Social History   Occupational History  . 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 Smoking status: Former Games developer  . Smokeless tobacco: Never Used     Comment: quit 30 years ago  . Alcohol use No  . Drug use: No  . Sexual activity: Not on file

## 2017-01-20 ENCOUNTER — Emergency Department (HOSPITAL_COMMUNITY): Payer: Medicare Other

## 2017-01-20 ENCOUNTER — Encounter (HOSPITAL_COMMUNITY): Payer: Self-pay | Admitting: Emergency Medicine

## 2017-01-20 ENCOUNTER — Inpatient Hospital Stay (HOSPITAL_COMMUNITY)
Admission: EM | Admit: 2017-01-20 | Discharge: 2017-01-25 | DRG: 871 | Disposition: A | Discharge status: Skilled Nursing Facility | Payer: Medicare Other | Attending: Internal Medicine | Admitting: Internal Medicine

## 2017-01-20 ENCOUNTER — Inpatient Hospital Stay (HOSPITAL_COMMUNITY): Payer: Medicare Other

## 2017-01-20 DIAGNOSIS — G9341 Metabolic encephalopathy: Secondary | ICD-10-CM | POA: Diagnosis present

## 2017-01-20 DIAGNOSIS — T148XXA Other injury of unspecified body region, initial encounter: Secondary | ICD-10-CM | POA: Diagnosis not present

## 2017-01-20 DIAGNOSIS — E860 Dehydration: Secondary | ICD-10-CM | POA: Diagnosis not present

## 2017-01-20 DIAGNOSIS — Z87891 Personal history of nicotine dependence: Secondary | ICD-10-CM

## 2017-01-20 DIAGNOSIS — R2681 Unsteadiness on feet: Secondary | ICD-10-CM | POA: Diagnosis not present

## 2017-01-20 DIAGNOSIS — E11621 Type 2 diabetes mellitus with foot ulcer: Secondary | ICD-10-CM | POA: Diagnosis present

## 2017-01-20 DIAGNOSIS — R7989 Other specified abnormal findings of blood chemistry: Secondary | ICD-10-CM

## 2017-01-20 DIAGNOSIS — M86172 Other acute osteomyelitis, left ankle and foot: Secondary | ICD-10-CM | POA: Diagnosis present

## 2017-01-20 DIAGNOSIS — J449 Chronic obstructive pulmonary disease, unspecified: Secondary | ICD-10-CM | POA: Diagnosis not present

## 2017-01-20 DIAGNOSIS — Z6825 Body mass index (BMI) 25.0-25.9, adult: Secondary | ICD-10-CM

## 2017-01-20 DIAGNOSIS — A4101 Sepsis due to Methicillin susceptible Staphylococcus aureus: Secondary | ICD-10-CM | POA: Diagnosis not present

## 2017-01-20 DIAGNOSIS — S0990XA Unspecified injury of head, initial encounter: Secondary | ICD-10-CM | POA: Diagnosis not present

## 2017-01-20 DIAGNOSIS — E43 Unspecified severe protein-calorie malnutrition: Secondary | ICD-10-CM | POA: Diagnosis present

## 2017-01-20 DIAGNOSIS — N179 Acute kidney failure, unspecified: Secondary | ICD-10-CM

## 2017-01-20 DIAGNOSIS — I739 Peripheral vascular disease, unspecified: Secondary | ICD-10-CM | POA: Diagnosis not present

## 2017-01-20 DIAGNOSIS — B9561 Methicillin susceptible Staphylococcus aureus infection as the cause of diseases classified elsewhere: Secondary | ICD-10-CM | POA: Diagnosis present

## 2017-01-20 DIAGNOSIS — E440 Moderate protein-calorie malnutrition: Secondary | ICD-10-CM | POA: Diagnosis not present

## 2017-01-20 DIAGNOSIS — M86672 Other chronic osteomyelitis, left ankle and foot: Secondary | ICD-10-CM | POA: Diagnosis not present

## 2017-01-20 DIAGNOSIS — R4182 Altered mental status, unspecified: Secondary | ICD-10-CM | POA: Diagnosis not present

## 2017-01-20 DIAGNOSIS — I129 Hypertensive chronic kidney disease with stage 1 through stage 4 chronic kidney disease, or unspecified chronic kidney disease: Secondary | ICD-10-CM | POA: Diagnosis not present

## 2017-01-20 DIAGNOSIS — N183 Chronic kidney disease, stage 3 (moderate): Secondary | ICD-10-CM | POA: Diagnosis present

## 2017-01-20 DIAGNOSIS — N390 Urinary tract infection, site not specified: Secondary | ICD-10-CM | POA: Diagnosis not present

## 2017-01-20 DIAGNOSIS — R2689 Other abnormalities of gait and mobility: Secondary | ICD-10-CM | POA: Diagnosis not present

## 2017-01-20 DIAGNOSIS — M6282 Rhabdomyolysis: Secondary | ICD-10-CM | POA: Diagnosis present

## 2017-01-20 DIAGNOSIS — Z7189 Other specified counseling: Secondary | ICD-10-CM

## 2017-01-20 DIAGNOSIS — L97529 Non-pressure chronic ulcer of other part of left foot with unspecified severity: Secondary | ICD-10-CM | POA: Diagnosis not present

## 2017-01-20 DIAGNOSIS — Z515 Encounter for palliative care: Secondary | ICD-10-CM | POA: Diagnosis not present

## 2017-01-20 DIAGNOSIS — E1122 Type 2 diabetes mellitus with diabetic chronic kidney disease: Secondary | ICD-10-CM | POA: Diagnosis present

## 2017-01-20 DIAGNOSIS — R748 Abnormal levels of other serum enzymes: Secondary | ICD-10-CM

## 2017-01-20 DIAGNOSIS — M869 Osteomyelitis, unspecified: Secondary | ICD-10-CM | POA: Diagnosis not present

## 2017-01-20 DIAGNOSIS — R1310 Dysphagia, unspecified: Secondary | ICD-10-CM | POA: Diagnosis present

## 2017-01-20 DIAGNOSIS — L97524 Non-pressure chronic ulcer of other part of left foot with necrosis of bone: Secondary | ICD-10-CM

## 2017-01-20 DIAGNOSIS — R1311 Dysphagia, oral phase: Secondary | ICD-10-CM | POA: Diagnosis not present

## 2017-01-20 DIAGNOSIS — E878 Other disorders of electrolyte and fluid balance, not elsewhere classified: Secondary | ICD-10-CM | POA: Diagnosis present

## 2017-01-20 DIAGNOSIS — L97509 Non-pressure chronic ulcer of other part of unspecified foot with unspecified severity: Secondary | ICD-10-CM

## 2017-01-20 DIAGNOSIS — I5022 Chronic systolic (congestive) heart failure: Secondary | ICD-10-CM | POA: Diagnosis present

## 2017-01-20 DIAGNOSIS — I130 Hypertensive heart and chronic kidney disease with heart failure and stage 1 through stage 4 chronic kidney disease, or unspecified chronic kidney disease: Secondary | ICD-10-CM | POA: Diagnosis present

## 2017-01-20 DIAGNOSIS — M25559 Pain in unspecified hip: Secondary | ICD-10-CM

## 2017-01-20 DIAGNOSIS — R413 Other amnesia: Secondary | ICD-10-CM

## 2017-01-20 DIAGNOSIS — B999 Unspecified infectious disease: Secondary | ICD-10-CM | POA: Diagnosis not present

## 2017-01-20 DIAGNOSIS — W010XXA Fall on same level from slipping, tripping and stumbling without subsequent striking against object, initial encounter: Secondary | ICD-10-CM

## 2017-01-20 DIAGNOSIS — E870 Hyperosmolality and hypernatremia: Secondary | ICD-10-CM | POA: Diagnosis not present

## 2017-01-20 DIAGNOSIS — E1151 Type 2 diabetes mellitus with diabetic peripheral angiopathy without gangrene: Secondary | ICD-10-CM | POA: Diagnosis present

## 2017-01-20 DIAGNOSIS — R7881 Bacteremia: Secondary | ICD-10-CM | POA: Diagnosis not present

## 2017-01-20 DIAGNOSIS — I10 Essential (primary) hypertension: Secondary | ICD-10-CM | POA: Diagnosis not present

## 2017-01-20 DIAGNOSIS — S79912A Unspecified injury of left hip, initial encounter: Secondary | ICD-10-CM | POA: Diagnosis not present

## 2017-01-20 DIAGNOSIS — M6281 Muscle weakness (generalized): Secondary | ICD-10-CM | POA: Diagnosis not present

## 2017-01-20 DIAGNOSIS — I493 Ventricular premature depolarization: Secondary | ICD-10-CM | POA: Diagnosis present

## 2017-01-20 DIAGNOSIS — E1169 Type 2 diabetes mellitus with other specified complication: Secondary | ICD-10-CM | POA: Diagnosis present

## 2017-01-20 DIAGNOSIS — Z8379 Family history of other diseases of the digestive system: Secondary | ICD-10-CM | POA: Diagnosis not present

## 2017-01-20 DIAGNOSIS — A419 Sepsis, unspecified organism: Secondary | ICD-10-CM

## 2017-01-20 DIAGNOSIS — E872 Acidosis: Secondary | ICD-10-CM | POA: Diagnosis not present

## 2017-01-20 DIAGNOSIS — M25552 Pain in left hip: Secondary | ICD-10-CM | POA: Diagnosis not present

## 2017-01-20 DIAGNOSIS — I361 Nonrheumatic tricuspid (valve) insufficiency: Secondary | ICD-10-CM | POA: Diagnosis not present

## 2017-01-20 DIAGNOSIS — Z66 Do not resuscitate: Secondary | ICD-10-CM | POA: Diagnosis present

## 2017-01-20 DIAGNOSIS — F0390 Unspecified dementia without behavioral disturbance: Secondary | ICD-10-CM | POA: Diagnosis present

## 2017-01-20 DIAGNOSIS — L03116 Cellulitis of left lower limb: Secondary | ICD-10-CM | POA: Diagnosis present

## 2017-01-20 DIAGNOSIS — I110 Hypertensive heart disease with heart failure: Secondary | ICD-10-CM | POA: Diagnosis not present

## 2017-01-20 DIAGNOSIS — L97909 Non-pressure chronic ulcer of unspecified part of unspecified lower leg with unspecified severity: Secondary | ICD-10-CM | POA: Diagnosis not present

## 2017-01-20 DIAGNOSIS — M79604 Pain in right leg: Secondary | ICD-10-CM | POA: Diagnosis not present

## 2017-01-20 DIAGNOSIS — R41841 Cognitive communication deficit: Secondary | ICD-10-CM | POA: Diagnosis not present

## 2017-01-20 HISTORY — DX: Unspecified osteoarthritis, unspecified site: M19.90

## 2017-01-20 LAB — URINALYSIS, ROUTINE W REFLEX MICROSCOPIC
Bilirubin Urine: NEGATIVE
Glucose, UA: NEGATIVE mg/dL
KETONES UR: 20 mg/dL — AB
Nitrite: NEGATIVE
PH: 5 (ref 5.0–8.0)
Protein, ur: 30 mg/dL — AB
Specific Gravity, Urine: 1.017 (ref 1.005–1.030)

## 2017-01-20 LAB — CBC AND DIFFERENTIAL
HEMATOCRIT: 54 — AB (ref 41–53)
Hemoglobin: 17.8 — AB (ref 13.5–17.5)
PLATELETS: 291 (ref 150–399)
WBC: 14.8

## 2017-01-20 LAB — CBC
HCT: 54.2 % — ABNORMAL HIGH (ref 39.0–52.0)
HEMOGLOBIN: 17.8 g/dL — AB (ref 13.0–17.0)
MCH: 30.2 pg (ref 26.0–34.0)
MCHC: 32.8 g/dL (ref 30.0–36.0)
MCV: 92 fL (ref 78.0–100.0)
Platelets: 291 10*3/uL (ref 150–400)
RBC: 5.89 MIL/uL — ABNORMAL HIGH (ref 4.22–5.81)
RDW: 13.8 % (ref 11.5–15.5)
WBC: 14.8 10*3/uL — ABNORMAL HIGH (ref 4.0–10.5)

## 2017-01-20 LAB — COMPREHENSIVE METABOLIC PANEL
ALBUMIN: 2.9 g/dL — AB (ref 3.5–5.0)
ALK PHOS: 79 U/L (ref 38–126)
ALT: 35 U/L (ref 17–63)
AST: 92 U/L — AB (ref 15–41)
Anion gap: 17 — ABNORMAL HIGH (ref 5–15)
BILIRUBIN TOTAL: 1 mg/dL (ref 0.3–1.2)
BUN: 67 mg/dL — AB (ref 6–20)
CALCIUM: 9.2 mg/dL (ref 8.9–10.3)
CO2: 26 mmol/L (ref 22–32)
CREATININE: 2.17 mg/dL — AB (ref 0.61–1.24)
Chloride: 107 mmol/L (ref 101–111)
GFR calc Af Amer: 29 mL/min — ABNORMAL LOW (ref >60)
GFR calc non Af Amer: 25 mL/min — ABNORMAL LOW (ref >60)
Glucose, Bld: 143 mg/dL — ABNORMAL HIGH (ref 65–99)
Potassium: 4.8 mmol/L (ref 3.5–5.1)
SODIUM: 150 mmol/L — AB (ref 135–145)
Total Protein: 7.7 g/dL (ref 6.5–8.1)

## 2017-01-20 LAB — BASIC METABOLIC PANEL
BUN: 67 — AB (ref 4–21)
CREATININE: 2.2 — AB (ref 0.6–1.3)
Glucose: 143
Potassium: 4.8 (ref 3.4–5.3)
Sodium: 150 — AB (ref 137–147)

## 2017-01-20 LAB — CK: Total CK: 2166 U/L — ABNORMAL HIGH (ref 49–397)

## 2017-01-20 LAB — PREALBUMIN: Prealbumin: 6.8 mg/dL — ABNORMAL LOW (ref 18–38)

## 2017-01-20 LAB — LACTIC ACID, PLASMA: Lactic Acid, Venous: 1.7 mmol/L (ref 0.5–1.9)

## 2017-01-20 LAB — C-REACTIVE PROTEIN: CRP: 12.9 mg/dL — ABNORMAL HIGH (ref <1.0)

## 2017-01-20 LAB — I-STAT CG4 LACTIC ACID, ED: Lactic Acid, Venous: 2.97 mmol/L (ref 0.5–1.9)

## 2017-01-20 LAB — HEPATIC FUNCTION PANEL: Bilirubin, Total: 1

## 2017-01-20 LAB — SEDIMENTATION RATE: SED RATE: 17 mm/h — AB (ref 0–16)

## 2017-01-20 MED ORDER — METRONIDAZOLE IN NACL 5-0.79 MG/ML-% IV SOLN
500.0000 mg | Freq: Three times a day (TID) | INTRAVENOUS | Status: DC
  Administered 2017-01-20 – 2017-01-22 (×6): 500 mg via INTRAVENOUS
  Filled 2017-01-20 (×6): qty 100

## 2017-01-20 MED ORDER — ACETAMINOPHEN 325 MG PO TABS: 650.0000 mg | ORAL_TABLET | Freq: Four times a day (QID) | ORAL | Status: DC | PRN

## 2017-01-20 MED ORDER — DEXTROSE 5 % IV SOLN: 1.0000 g | INTRAVENOUS | Status: DC

## 2017-01-20 MED ORDER — BISACODYL 10 MG RE SUPP: 10.0000 mg | Freq: Every day | RECTAL | Status: DC | PRN

## 2017-01-20 MED ORDER — DEXTROSE 5 % IV SOLN
2.0000 g | Freq: Once | INTRAVENOUS | Status: AC
  Administered 2017-01-20: 2 g via INTRAVENOUS
  Filled 2017-01-20: qty 2

## 2017-01-20 MED ORDER — HYDRALAZINE HCL 20 MG/ML IJ SOLN
5.0000 mg | INTRAMUSCULAR | Status: DC | PRN
  Filled 2017-01-20: qty 0.25

## 2017-01-20 MED ORDER — ENOXAPARIN SODIUM 30 MG/0.3ML ~~LOC~~ SOLN
30.0000 mg | SUBCUTANEOUS | Status: DC
  Administered 2017-01-20 – 2017-01-22 (×3): 30 mg via SUBCUTANEOUS
  Filled 2017-01-20 (×3): qty 0.3

## 2017-01-20 MED ORDER — DEXTROSE 5 % IV SOLN
2.0000 g | INTRAVENOUS | Status: DC
  Administered 2017-01-21 – 2017-01-22 (×2): 2 g via INTRAVENOUS
  Filled 2017-01-20 (×2): qty 2

## 2017-01-20 MED ORDER — SODIUM CHLORIDE 0.9 % IV BOLUS (SEPSIS)
1000.0000 mL | Freq: Once | INTRAVENOUS | Status: AC
  Administered 2017-01-20: 1000 mL via INTRAVENOUS

## 2017-01-20 MED ORDER — SODIUM CHLORIDE 0.9 % IV SOLN
INTRAVENOUS | Status: DC
  Administered 2017-01-20: 150 mL/h via INTRAVENOUS
  Administered 2017-01-21: 08:00:00 via INTRAVENOUS

## 2017-01-20 MED ORDER — ALBUTEROL SULFATE (2.5 MG/3ML) 0.083% IN NEBU: 2.5000 mg | INHALATION_SOLUTION | RESPIRATORY_TRACT | Status: DC | PRN

## 2017-01-20 MED ORDER — VANCOMYCIN HCL IN DEXTROSE 750-5 MG/150ML-% IV SOLN
750.0000 mg | INTRAVENOUS | Status: DC
  Administered 2017-01-20: 750 mg via INTRAVENOUS
  Filled 2017-01-20 (×2): qty 150

## 2017-01-20 MED ORDER — ACETAMINOPHEN 650 MG RE SUPP: 650.0000 mg | Freq: Four times a day (QID) | RECTAL | Status: DC | PRN

## 2017-01-20 NOTE — ED Notes (Signed)
[facility] called stating that they can take patient now.

## 2017-01-20 NOTE — ED Notes (Signed)
Family request update from MD; Denton LankSteinl made aware and en route to update regarding plan of care.

## 2017-01-20 NOTE — Consult Note (Signed)
WOC Nurse wound consult note Reason for Consult:Ulcers to left great toe with chronic bone deformities to second and third toes.  Smoker with arterial disease, DM.  Findings consitent with osteomyelitis.  Purulence, erythema, induration and X-ray  Wound type:Infectious Pressure Injury POA: Yes  Neuropathic ulcers to left toes.  Measurement:Great toe:  2 cm x 4 cm nonintact lesion with 1 cm bed of slough present.  Otherwise, friable, red and indirated.  Wound VWU:JWJXBJYbed:Friable Drainage (amount, consistency, odor) Minimal serosanguinous.  Strong, unsanitary smell, appears to come from all of body habitus.   Periwound:erythema Dressing procedure/placement/frequency:Cleanse toes to left foot with NS.  Cut calcium alginate into strips and weave between toes and to wound bed.  Cover with kerlix and tape.  Change daily.   Will not follow at this time.  Please re-consult if needed.  Maple HudsonKaren Zakhari Fogel RN BSN CWON Pager [phone]

## 2017-01-20 NOTE — ED Notes (Signed)
Called Floor to give report.  Dahlia ClientHannah RN unavailable unable to take report at this time, awaiting a call back.

## 2017-01-20 NOTE — ED Provider Notes (Addendum)
[facility]-EMERGENCY DEPT Provider Note   CSN: [id number] Arrival date & time: 01/20/17  1040     History   Chief Complaint Chief Complaint  Patient presents with  . Fall    HPI [name] [name] is a 83 y.o. male.  Patient s/p fall last night.  Unclear how long pt laid on floor. Pt very limited historian - level 5 caveat. Pt denies pain. No fever. No headache. No chest pain. No neck or back pain.  EMS notes report of general decline in health and strength in the past several months.    The history is provided by the patient and the EMS personnel. The history is limited by the condition of the patient.  Fall  Pertinent negatives include no chest pain, no abdominal pain, no headaches and no shortness of breath.    Past Medical History:  Diagnosis Date  . Asthma   . Diverticulosis of colon   . Dizziness   . HTN (hypertension)   . PAD (peripheral artery disease) (HCC)    severe bilateral lower ext disease. Runoff 1/12 per Cr. Chen with severe disease R SFA and below the knee    Patient Active Problem List   Diagnosis Date Noted  . TOBACCO ABUSE 09/21/2010  . CELLULITIS AND ABSCESS OF LEG EXCEPT FOOT 09/11/2010  . PVD 07/04/2010  . HYPERCHOLESTEROLEMIA 07/03/2010  . HYPERTENSION 07/03/2010  . ASTHMA 07/03/2010  . COPD 07/03/2010  . DIZZINESS 07/03/2010  . CHEST DISCOMFORT 07/03/2010  . DIVERTICULOSIS, COLON, HX OF 07/03/2010  . ATHEROSLERO NATIVE ART EXTREMITIES W/ULCERATION 06/22/2010    Past Surgical History:  Procedure Laterality Date  . APPENDECTOMY    . exploration debridement     repair of Achilles Tendon repture, right  . perforated large intestines    . right carotic endarentomy  2005  . TONSILLECTOMY         Home Medications    Prior to Admission medications   Medication Sig Start Date End Date Taking? Authorizing Provider  albuterol (PROAIR HFA) 108 (90 BASE) MCG/ACT inhaler as directed.      [provider]  ALPRAZolam Prudy Feeler) 0.5 MG  tablet Take 0.25 mg by mouth at bedtime as needed.      [provider]  amLODipine (NORVASC) 10 MG tablet Take 10 mg by mouth daily.      [provider]  aspirin 81 MG tablet Take 81 mg by mouth daily.      [provider]  celecoxib (CELEBREX) 200 MG capsule Take 200 mg by mouth daily.      [provider]  cephALEXin (KEFLEX) 500 MG capsule Take 500 mg by mouth 4 (four) times daily.      [provider]  fluticasone (FLONASE) 50 MCG/ACT nasal spray as needed.      [provider]  Multiple Vitamin (MULTIVITAMIN PO) Take 1 tablet by mouth daily.      [provider]  Multiple Vitamins-Minerals (PRESERVISION AREDS) CAPS Take 2 capsules by mouth daily.      [provider]  tiotropium (SPIRIVA HANDIHALER) 18 MCG inhalation capsule Place 18 mcg into inhaler and inhale daily.      [provider]    Family History Family History  Problem Relation Age of Onset  . Liver disease Brother     Social History Social History  Substance Use Topics  . Smoking status: Former Games developer  . Smokeless tobacco: Never Used     Comment: quit 30 years ago  .  Alcohol use No     Allergies   Patient has no known allergies.   Review of Systems Review of Systems  Constitutional: Negative for fever.  HENT: Negative for sore throat.   Eyes: Negative for visual disturbance.  Respiratory: Negative for shortness of breath.   Cardiovascular: Negative for chest pain.  Gastrointestinal: Negative for abdominal pain and vomiting.  Genitourinary: Negative for flank pain.  Musculoskeletal: Negative for back pain and neck pain.  Skin: Negative for rash.  Neurological: Negative for headaches.  Hematological: Does not bruise/bleed easily.  Psychiatric/Behavioral: Negative for dysphoric mood.     Physical Exam Updated Vital Signs BP (!) 159/80 (BP Location: Right Arm)   Pulse (!) 102   Temp 97.8 F (36.6 C) (Oral)   Resp 16    Ht 1.829 m (6')   Wt 83.9 kg (185 lb)   SpO2 97%   BMI 25.09 kg/m   Physical Exam  Constitutional: He appears well-developed and well-nourished. No distress.  HENT:  Contusion scalp. Dry mucous membranes  Eyes: Conjunctivae are normal. Pupils are equal, round, and reactive to light.  Neck: Neck supple. No tracheal deviation present.  No stiffness or rigidity  Cardiovascular: Normal rate, regular rhythm, normal heart sounds and intact distal pulses.   Pulmonary/Chest: Effort normal and breath sounds normal. No accessory muscle usage. No respiratory distress. He exhibits no tenderness.  Abdominal: Soft. Bowel sounds are normal. He exhibits no distension. There is no tenderness.  Genitourinary:  Genitourinary Comments: No cva tenderness  Musculoskeletal: He exhibits no edema.  CTLS spine, non tender, aligned, no step off. Good rom bil extremities, no focal bony tenderness. Pt with several scabs/chr appearing lesions to left foot, 1.5 cm diam ulcer to side of left knee.   Neurological: He is alert.  Skin: Skin is warm and dry. No rash noted. He is not diaphoretic.  Psychiatric: He has a normal mood and affect.  Nursing note and vitals reviewed.    ED Treatments / Results  Labs (all labs ordered are listed, but only abnormal results are displayed) Results for orders placed or performed during the hospital encounter of 01/20/17  Comprehensive metabolic panel  Result Value Ref Range   Sodium 150 (H) 135 - 145 mmol/L   Potassium 4.8 3.5 - 5.1 mmol/L   Chloride 107 101 - 111 mmol/L   CO2 26 22 - 32 mmol/L   Glucose, Bld 143 (H) 65 - 99 mg/dL   BUN 67 (H) 6 - 20 mg/dL   Creatinine, Ser 1.61 (H) 0.61 - 1.24 mg/dL   Calcium 9.2 8.9 - 09.6 mg/dL   Total Protein 7.7 6.5 - 8.1 g/dL   Albumin 2.9 (L) 3.5 - 5.0 g/dL   AST 92 (H) 15 - 41 U/L   ALT 35 17 - 63 U/L   Alkaline Phosphatase 79 38 - 126 U/L   Total Bilirubin 1.0 0.3 - 1.2 mg/dL   GFR calc non Af Amer 25 (L) >60 mL/min   GFR  calc Af Amer 29 (L) >60 mL/min   Anion gap 17 (H) 5 - 15  CBC  Result Value Ref Range   WBC 14.8 (H) 4.0 - 10.5 K/uL   RBC 5.89 (H) 4.22 - 5.81 MIL/uL   Hemoglobin 17.8 (H) 13.0 - 17.0 g/dL   HCT 04.5 (H) 40.9 - 81.1 %   MCV 92.0 78.0 - 100.0 fL   MCH 30.2 26.0 - 34.0 pg   MCHC 32.8 30.0 - 36.0 g/dL   RDW  13.8 11.5 - 15.5 %   Platelets 291 150 - 400 K/uL  Urinalysis, Routine w reflex microscopic  Result Value Ref Range   Color, Urine AMBER (A) YELLOW   APPearance CLOUDY (A) CLEAR   Specific Gravity, Urine 1.017 1.005 - 1.030   pH 5.0 5.0 - 8.0   Glucose, UA NEGATIVE NEGATIVE mg/dL   Hgb urine dipstick MODERATE (A) NEGATIVE   Bilirubin Urine NEGATIVE NEGATIVE   Ketones, ur 20 (A) NEGATIVE mg/dL   Protein, ur 30 (A) NEGATIVE mg/dL   Nitrite NEGATIVE NEGATIVE   Leukocytes, UA LARGE (A) NEGATIVE   RBC / HPF 6-30 0 - 5 RBC/hpf   WBC, UA TOO NUMEROUS TO COUNT 0 - 5 WBC/hpf   Bacteria, UA RARE (A) NONE SEEN   Squamous Epithelial / LPF 0-5 (A) NONE SEEN   WBC Clumps PRESENT    Hyaline Casts, UA PRESENT   CK  Result Value Ref Range   Total CK 2,166 (H) 49 - 397 U/L  I-Stat CG4 Lactic Acid, ED  (not at  [facility])  Result Value Ref Range   Lactic Acid, Venous 2.97 (HH) 0.5 - 1.9 mmol/L   Comment NOTIFIED PHYSICIAN    Ct Head Wo Contrast  Result Date: 01/20/2017 CLINICAL DATA:  Unwitnessed fall last night. EXAM: CT HEAD WITHOUT CONTRAST TECHNIQUE: Contiguous axial images were obtained from the base of the skull through the vertex without intravenous contrast. COMPARISON:  CT scan of September 22, 2015. FINDINGS: Brain: Mild diffuse cortical atrophy is noted No mass effect or midline shift is noted. Ventricular size is within normal limits. There is no evidence of mass lesion, hemorrhage or acute infarction. Vascular: Atherosclerosis of carotid siphons is noted. Skull: Normal. Negative for fracture or focal lesion. Sinuses/Orbits: No acute finding. Other: None. IMPRESSION: Mild diffuse cortical  atrophy. No acute intracranial abnormality seen. Electronically Signed   By: Lupita RaiderJames  Green Jr, M.D.   On: 01/20/2017 12:17    EKG  EKG Interpretation  Date/Time:  Monday January 20 2017 11:40:20 EDT Ventricular Rate:  102 PR Interval:    QRS Duration: 110 QT Interval:  360 QTC Calculation: 469 R Axis:   30 Text Interpretation:  Sinus tachycardia Multiple ventricular premature complexes Non-specific ST-t changes Confirmed by Denton LankSTEINL  MD, Caryn BeeKEVIN ([id number]) on 01/20/2017 11:52:22 AM Also confirmed by Denton LankSTEINL  MD, Caryn BeeKEVIN ([id number]), editor Misty StanleyScales-Price, Shannon [phone]([id number])  on 01/20/2017 12:01:32 PM       Radiology No results found.  Procedures Procedures (including critical care time)  Medications Ordered in ED Medications  sodium chloride 0.9 % bolus 1,000 mL (not administered)     Initial Impression / Assessment and Plan / ED Course  I have reviewed the triage vital signs and the nursing notes.  Pertinent labs & imaging results that were available during my care of the patient were reviewed by me and considered in my medical decision making (see chart for details).  Iv ns bolus. Labs. Ct.  Reviewed nursing notes and prior charts for additional history.    1 liter ns bolus.  Cultures sent.   Lactate elevated.  Additional iv ns boluses.  Rocephin iv for uti.  Hospitalists consulted for admission.  Family member notes pt may need ecf placement when ready for inpatient discharge as prolonged period failure to thrive living on own.    Final Clinical Impressions(s) / ED Diagnoses   Final diagnoses:  None    New Prescriptions New Prescriptions   No medications on file  Cathren Laine, MD 01/20/17 [phone]

## 2017-01-20 NOTE — ED Notes (Signed)
Gave report to another Nurse on 5 east since GatesHannah RN is unavailable at this time.  Was told that floor unable to take patient until another patient is transferred off their unit.  Was told they would call when floor is able to take patient.

## 2017-01-20 NOTE — ED Notes (Signed)
Both sets of blood cultures have been drawn prior to antibiotic start.

## 2017-01-20 NOTE — ED Notes (Signed)
Abnormal lab result MD Steinl have been made aware 

## 2017-01-20 NOTE — ED Notes (Signed)
Wound Nurse called stating that she will be coming down to see patient while in ED.

## 2017-01-20 NOTE — ED Notes (Signed)
Patient has foul odor coming from him. EMS reports that patient had incontinence while he was lying on floor for unknown amount of time.   This RN and NT attempted to wash patient up.  Patient refusing for staff to assist in cleaning him, states, "I'm good, I told you I'm good!"

## 2017-01-20 NOTE — H&P (Addendum)
History and Physical    CEZAR MISIASZEK UEA:[id number] DOB: 09/27/1923 DOA: 01/20/2017  Referring MD/NP/PA: Cathren Laine PCP: Gaspar Garbe, MD   Patient coming from: home  Chief Complaint: AMS  HPI: [name] [name] is a 83 y.o. male with history of memory loss, hypertension, asthma, severe peripheral arterial disease who presents with progressive weakness and confusion. For the last several months, the patient has not been able to perform all of his ADLs and has been more confused. He lives alone and family occasionally checks on him. For the last week, he has been even more weak than usual and he was found on the floor by his daughter and son-in-law. He was crawling around his house to get to what he needed. He refused to come to the emergency department. The patient is too lethargic to contribute to history but the daughter states that he has been having some sweating for the last few days suggestive of fever. She has not noticed any signs of sinus congestion or cough. He has not complained of any pains. Despite lying on the floor for the last couple of days there has been no obvious emesis or diarrhea.  The daughter states he has not eaten or drank anything for about a week. When they try to offer her something to drink it fell out of his mouth. They did not offer him anything to eat yesterday.  ED Course: Vital signs afebrile, heart rate in the 90s to low 100s, blood pressure mildly elevated, stable on room air.  Labs:  Debbie BC 14.8, hemoglobin 17.8.  Na 150, creatinine 2.17, BUN 67.  Lactic acid 2.97.  CK 2166.  Urinalysis was positive.  Head CT demonstrate no acute change. X-ray of the left foot demonstrates evidence of osteomyelitis of the first and second toes.  Chest x-ray not completed secondary to patient not having any symptoms and already on broad-spectrum antibiotics.    Review of Systems:  Patient lethargic, nonverbal.    Past Medical History:  Diagnosis Date  .  Arthritis   . Asthma   . Diverticulosis of colon   . Dizziness   . HTN (hypertension)   . PAD (peripheral artery disease) (HCC)    severe bilateral lower ext disease. Runoff 1/12 per Cr. Chen with severe disease R SFA and below the knee    Past Surgical History:  Procedure Laterality Date  . APPENDECTOMY    . exploration debridement     repair of Achilles Tendon repture, right  . perforated large intestines    . right carotic endarentomy  2005  . TONSILLECTOMY       reports that he has quit smoking. He has never used smokeless tobacco. He reports that he does not drink alcohol or use drugs.  No Known Allergies  Family History  Problem Relation Age of Onset  . Liver disease Brother     Prior to Admission medications   Medication Sig Start Date End Date Taking? Authorizing Provider  ibuprofen (ADVIL,MOTRIN) 200 MG tablet Take 200 mg by mouth every 6 (six) hours as needed.   Yes [provider]    Physical Exam: Vitals:   01/20/17 1400 01/20/17 1509 01/20/17 1600 01/20/17 1622  BP: (!) 161/88  (!) 160/88 (!) 140/98  Pulse: 95  89 99  Resp: 20  16 16   Temp:    98.1 F (36.7 C)  TempSrc:    Oral  SpO2: 96% 98% 98% 95%  Weight:  Height:        Constitutional:  Thin adult male, eyes closed, mouth gaping open Eyes: PERRL, lids and conjunctivae normal ENMT:   Dry MM.  dried secretions hanging from roof of mouth, tongue dry, lips cracked Neck:  No nuchal rigidity, no masses Respiratory:  No wheezes, rales, or rhonchi Cardiovascular: mild tachycardia, no murmurs / rubs / gallops.  1+ pedal pulse on the left Abdomen:  Normal active bowel sounds, soft, nondistended, nontender Musculoskeletal:  Decreased muscle bulk, increased tone, hands are slightly contracted.   Skin:  Left side of back:  Upper back with skin tear, lower back with skin tear, left buttock with large skin tear.  Entire left side of back with stage 1 pressure ulcer/redness.  Left lateral knee  with skin abrasions.  Left lateral malleolus and head of 5th metatarsal with stage 3 pressure ulcers.  Left forefoot erthematous with streaks of red going up foot towards ankle.  Medial side of 1st toe and plantar aspect of base of first toe are ulcerated with purulent discharge.  Another 1cm unstageable ulcer on the plantar aspect of the medial forefoot also with purulent and foul discharge.  Neurologic:  Left mouth droops slightly but due to no dentures per daughter.  Grossly moves all extremities.   Psychiatric:  Asleep, barely opens eyes to verbal stimuli, keeps eyes closed during my exam and tries to push my hands away when I press on his abdomen.    Labs on Admission: I have personally reviewed following labs and imaging studies  CBC:  Recent Labs Lab 01/20/17 1119  WBC 14.8*  HGB 17.8*  HCT 54.2*  MCV 92.0  PLT 291   Basic Metabolic Panel:  Recent Labs Lab 01/20/17 1119  NA 150*  K 4.8  CL 107  CO2 26  GLUCOSE 143*  BUN 67*  CREATININE 2.17*  CALCIUM 9.2   GFR: Estimated Creatinine Clearance: 24.8 mL/min (A) (by C-G formula based on SCr of 2.17 mg/dL (H)). Liver Function Tests:  Recent Labs Lab 01/20/17 1119  AST 92*  ALT 35  ALKPHOS 79  BILITOT 1.0  PROT 7.7  ALBUMIN 2.9*   No results for input(s): LIPASE, AMYLASE in the last 168 hours. No results for input(s): AMMONIA in the last 168 hours. Coagulation Profile: No results for input(s): INR, PROTIME in the last 168 hours. Cardiac Enzymes:  Recent Labs Lab 01/20/17 1119  CKTOTAL 2,166*   BNP (last 3 results) No results for input(s): PROBNP in the last 8760 hours. HbA1C: No results for input(s): HGBA1C in the last 72 hours. CBG: No results for input(s): GLUCAP in the last 168 hours. Lipid Profile: No results for input(s): CHOL, HDL, LDLCALC, TRIG, CHOLHDL, LDLDIRECT in the last 72 hours. Thyroid Function Tests: No results for input(s): TSH, T4TOTAL, FREET4, T3FREE, THYROIDAB in the last 72  hours. Anemia Panel: No results for input(s): VITAMINB12, FOLATE, FERRITIN, TIBC, IRON, RETICCTPCT in the last 72 hours. Urine analysis:    Component Value Date/Time   COLORURINE AMBER (A) 01/20/2017 1119   APPEARANCEUR CLOUDY (A) 01/20/2017 1119   LABSPEC 1.017 01/20/2017 1119   PHURINE 5.0 01/20/2017 1119   GLUCOSEU NEGATIVE 01/20/2017 1119   HGBUR MODERATE (A) 01/20/2017 1119   BILIRUBINUR NEGATIVE 01/20/2017 1119   KETONESUR 20 (A) 01/20/2017 1119   PROTEINUR 30 (A) 01/20/2017 1119   NITRITE NEGATIVE 01/20/2017 1119   LEUKOCYTESUR LARGE (A) 01/20/2017 1119   Sepsis Labs: @LABRCNTIP (procalcitonin:4,lacticidven:4) )No results found for this or any previous visit (from  the past 240 hour(s)).   Radiological Exams on Admission: Ct Head Wo Contrast  Result Date: 01/20/2017 CLINICAL DATA:  Unwitnessed fall last night. EXAM: CT HEAD WITHOUT CONTRAST TECHNIQUE: Contiguous axial images were obtained from the base of the skull through the vertex without intravenous contrast. COMPARISON:  CT scan of September 22, 2015. FINDINGS: Brain: Mild diffuse cortical atrophy is noted No mass effect or midline shift is noted. Ventricular size is within normal limits. There is no evidence of mass lesion, hemorrhage or acute infarction. Vascular: Atherosclerosis of carotid siphons is noted. Skull: Normal. Negative for fracture or focal lesion. Sinuses/Orbits: No acute finding. Other: None. IMPRESSION: Mild diffuse cortical atrophy. No acute intracranial abnormality seen. Electronically Signed   By: Lupita RaiderJames  Green Jr, M.D.   On: 01/20/2017 12:17   Dg Foot Complete Left  Result Date: 01/20/2017 CLINICAL DATA:  Left great toe ulcer. No known injury. Known peripheral vascular disease, former smoker. EXAM: LEFT FOOT - COMPLETE 3+ VIEW COMPARISON:  None in PACs FINDINGS: There is bony resorption of the entire distal phalanx of the second toe. There is partial resorption of the distal phalanx of the great toe as well as  lateral subluxation of the base of the distal phalanx with respect to the head of the proximal phalanx of the great toe. The other phalanges appear intact. There is chronic bony resorption of the head of the second metatarsal and a portion of the base of the proximal phalanx of the second toe. There are vascular calcifications. There are plantar and Achilles region calcaneal spurs. No tarsal bone abnormalities are observed. IMPRESSION: Findings worrisome for osteomyelitis of the great toe and second toe distally. Probable chronic arthropathic change of the second MTP joint. Electronically Signed   By: David  SwazilandJordan M.D.   On: 01/20/2017 15:33    EKG: Independently reviewed. Sinus tachycardia, peaked P-waves  Assessment/Plan Principal Problem:   Sepsis (HCC) Active Problems:   Essential hypertension   PVD   COPD (chronic obstructive pulmonary disease) (HCC)   Foot ulcer (HCC)   Hypernatremia   Rhabdomyolysis   AKI (acute kidney injury) (HCC)   Severe dehydration with hypernatremia, leukocytosis, elevated hemoglobin, rhabdomyolysis and acute kidney injury, baseline creatinine of 1.4 was last done in 2012, currently 2.17 -  Repeat lactic acid -  Continue isotonic fluids until rehydrated, then will change to hypotonic to correct sodium -  Repeat BMP in AM -  Trend CK -  Minimize nephrotoxins and renally dose medications  Sepsis due to cellulitis with osteomyelitis likely secondary to his know peripheral vascular disease of the LEFT 1st and 2nd toes  -  Vancomycin, ceftriaxone, and flagyl -  Discussed consultation with vascular surgery or orthopedic surgery for amputation, however, daughter would just like treatment with antibiotics and IVF.  No surgeries or procedures.  If he decompensates, discussed transitioning to full palliative/comfort measures.   -  Wound care consultation -  Wet to dry pending wound care assessment -  Tylenol prn until more alert  Sepsis (tachycardia, leukocytosis)  may also be due to UTI -  F/u urine culture -  Ceftriaxone as above  Acute metabolic encephalopathy with poor oral intake (not eaten or drank in about a week) due to underlying infections and dehydration -  IVF and antibiotics -  Frequent reorientation -  Nutrition consultation -  Supplements when cleared to eat  Falls/generalized weakness -  PT/OT -  Falls precautions  Dysphagia -  SLP evaluation -  NPO until more alert  Dementia suggested by history -  B12, TSH, RPR  Peripheral arterial disease, likely contributing to toe ulcerations and infection -  Family does not want any surgeries or procedures.    Hypertension, blood pressure elevated -  Hydralazine prn  COPD, stable -  Albuterol prn  DVT prophylaxis: lovenox  Code Status: DNR Family Communication:  Daughter was present at bedside Disposition Plan: likely to SNF vs. Hospice depending on progression or further conversation with daughter Consults called: Palliative care  Admission status:  Inpatient, med-surg.  Patient at high risk for decompensation secondary to underlying dementia, osteomyelitis with open ulcers and cellulitis but not planning to undergo source control, infection, severe dehydration with kidney injury, rhabdo, and hypernatremia.    Renae Fickle MD [facility] Pager [phone]  If 7PM-7AM, please contact night-coverage [email or web address] Password [facility]  01/20/2017, 4:42 PM

## 2017-01-20 NOTE — ED Triage Notes (Addendum)
Per EMS pt complaint of right leg and left shoulder pain post fall last night; unwitnessed found by son in law on kitchen floor. Alert and oriented per normal; pt decline since wife's passing in 05/2016.

## 2017-01-20 NOTE — ED Notes (Signed)
Patient returned from xray.

## 2017-01-20 NOTE — ED Notes (Signed)
Steinl MD at bedside updating family regarding plan of care. Family denies questions regarding care post discussion.

## 2017-01-20 NOTE — Progress Notes (Signed)
Pharmacy Antibiotic Note  Jenetta DownerSherman E Simmonds is a 83 y.o. male admitted on 01/20/2017 s/p fall at home.  Pharmacy has been consulted for vancomycin dosing for possible wound infection; MD is dosing ceftriaxone and flagyl.  Plan:  Vancomycin 750mg  IV q24h  Check trough at steady state, goal 10-15 mcg/ml  Follow up renal function & adjust dose as needed  Follow up cultures and narrow as appropriate  Height: 6' (182.9 cm) Weight: 185 lb (83.9 kg) IBW/kg (Calculated) : 77.6  Temp (24hrs), Avg:97.8 F (36.6 C), Min:97.8 F (36.6 C), Max:97.8 F (36.6 C)   Recent Labs Lab 01/20/17 1119 01/20/17 1321  WBC 14.8*  --   CREATININE 2.17*  --   LATICACIDVEN  --  2.97*    Estimated Creatinine Clearance: 24.8 mL/min (A) (by C-G formula based on SCr of 2.17 mg/dL (H)).    No Known Allergies  Antimicrobials this admission:  6/4 Rocephin >> 6/4 Flagyl >> 6/4 Vanc >>  Dose adjustments this admission:  ---  Microbiology results:  6/4 BCx: sent 6/4 UCx: sent  Thank you for allowing pharmacy to be a part of this patient's care.  Loralee PacasErin Antoni Stefan, PharmD, BCPS Pager: [phone] 01/20/2017 4:07 PM

## 2017-01-20 NOTE — Progress Notes (Signed)
Consult request has been received. CSW attempting to follow up at present time.  Cailie Bosshart F. Amanda Pote, LCSWA, LCAS Clinical Social Worker Ph: [phone]  

## 2017-01-21 ENCOUNTER — Inpatient Hospital Stay (HOSPITAL_COMMUNITY): Payer: Medicare Other

## 2017-01-21 DIAGNOSIS — E870 Hyperosmolality and hypernatremia: Secondary | ICD-10-CM

## 2017-01-21 LAB — CBC
HEMATOCRIT: 47.9 % (ref 39.0–52.0)
HEMOGLOBIN: 15.3 g/dL (ref 13.0–17.0)
MCH: 30.1 pg (ref 26.0–34.0)
MCHC: 31.9 g/dL (ref 30.0–36.0)
MCV: 94.3 fL (ref 78.0–100.0)
Platelets: 201 10*3/uL (ref 150–400)
RBC: 5.08 MIL/uL (ref 4.22–5.81)
RDW: 14.5 % (ref 11.5–15.5)
WBC: 11.5 10*3/uL — ABNORMAL HIGH (ref 4.0–10.5)

## 2017-01-21 LAB — BLOOD CULTURE ID PANEL (REFLEXED)
ACINETOBACTER BAUMANNII: NOT DETECTED
CANDIDA ALBICANS: NOT DETECTED
CANDIDA KRUSEI: NOT DETECTED
Candida glabrata: NOT DETECTED
Candida parapsilosis: NOT DETECTED
Candida tropicalis: NOT DETECTED
ENTEROBACTER CLOACAE COMPLEX: NOT DETECTED
ENTEROBACTERIACEAE SPECIES: NOT DETECTED
ESCHERICHIA COLI: NOT DETECTED
Enterococcus species: NOT DETECTED
Haemophilus influenzae: NOT DETECTED
KLEBSIELLA OXYTOCA: NOT DETECTED
Klebsiella pneumoniae: NOT DETECTED
Listeria monocytogenes: NOT DETECTED
METHICILLIN RESISTANCE: NOT DETECTED
Neisseria meningitidis: NOT DETECTED
PSEUDOMONAS AERUGINOSA: NOT DETECTED
Proteus species: NOT DETECTED
STREPTOCOCCUS PNEUMONIAE: NOT DETECTED
STREPTOCOCCUS PYOGENES: NOT DETECTED
Serratia marcescens: NOT DETECTED
Staphylococcus aureus (BCID): DETECTED — AB
Staphylococcus species: DETECTED — AB
Streptococcus agalactiae: NOT DETECTED
Streptococcus species: NOT DETECTED

## 2017-01-21 LAB — TSH: TSH: 1.234 u[IU]/mL (ref 0.350–4.500)

## 2017-01-21 LAB — BASIC METABOLIC PANEL
Anion gap: 11 (ref 5–15)
BUN: 52 mg/dL — ABNORMAL HIGH (ref 6–20)
BUN: 52 — AB (ref 4–21)
CO2: 23 mmol/L (ref 22–32)
Calcium: 8.6 mg/dL — ABNORMAL LOW (ref 8.9–10.3)
Chloride: 117 mmol/L — ABNORMAL HIGH (ref 101–111)
Creatinine, Ser: 1.49 mg/dL — ABNORMAL HIGH (ref 0.61–1.24)
Creatinine: 1.5 — AB (ref 0.6–1.3)
GFR calc Af Amer: 46 mL/min — ABNORMAL LOW (ref >60)
GFR calc non Af Amer: 40 mL/min — ABNORMAL LOW (ref >60)
GLUCOSE: 94 mg/dL (ref 65–99)
Glucose: 94
POTASSIUM: 4.1 (ref 3.4–5.3)
POTASSIUM: 4.1 mmol/L (ref 3.5–5.1)
Sodium: 151 mmol/L — ABNORMAL HIGH (ref 135–145)
Sodium: 151 — AB (ref 137–147)

## 2017-01-21 LAB — CBC AND DIFFERENTIAL
HCT: 48 (ref 41–53)
Hemoglobin: 15.3 (ref 13.5–17.5)
Platelets: 201 (ref 150–399)
WBC: 11.5

## 2017-01-21 LAB — VITAMIN B12: Vitamin B-12: 2055 pg/mL — ABNORMAL HIGH (ref 180–914)

## 2017-01-21 LAB — CK: Total CK: 1053 U/L — ABNORMAL HIGH (ref 49–397)

## 2017-01-21 LAB — HIV ANTIBODY (ROUTINE TESTING W REFLEX): HIV SCREEN 4TH GENERATION: NONREACTIVE

## 2017-01-21 LAB — HEMOGLOBIN A1C
HEMOGLOBIN A1C: 5.5 % (ref 4.8–5.6)
MEAN PLASMA GLUCOSE: 111 mg/dL

## 2017-01-21 MED ORDER — VANCOMYCIN HCL IN DEXTROSE 1-5 GM/200ML-% IV SOLN
1000.0000 mg | INTRAVENOUS | Status: DC
  Administered 2017-01-21: 1000 mg via INTRAVENOUS
  Filled 2017-01-21: qty 200

## 2017-01-21 MED ORDER — NYSTATIN 100000 UNIT/ML MT SUSP
5.0000 mL | Freq: Four times a day (QID) | OROMUCOSAL | Status: DC
  Administered 2017-01-21 – 2017-01-25 (×13): 500000 [IU] via ORAL
  Filled 2017-01-21 (×12): qty 5

## 2017-01-21 MED ORDER — RESOURCE THICKENUP CLEAR PO POWD
ORAL | Status: DC | PRN
  Administered 2017-01-21 – 2017-01-25 (×2): via ORAL
  Filled 2017-01-21: qty 125

## 2017-01-21 MED ORDER — SODIUM CHLORIDE 0.45 % IV SOLN
INTRAVENOUS | Status: DC
  Administered 2017-01-21 (×2): via INTRAVENOUS

## 2017-01-21 MED ORDER — ENSURE ENLIVE PO LIQD
237.0000 mL | Freq: Two times a day (BID) | ORAL | Status: DC
  Administered 2017-01-21 – 2017-01-25 (×8): 237 mL via ORAL

## 2017-01-21 NOTE — Progress Notes (Signed)
PT Cancellation Note  Patient Details Name: [name] [name] MRN: [id number] DOB: [birth year]   Cancelled Treatment:    Reason Eval/Treat Not Completed: Patient declined,The patient was unable to be convinced to get up OOB this day. Check back tomorrow,  Rada HayHill, Korynne Dols Elizabeth 01/21/2017, 5:24 PM Blanchard KelchKaren Dwayna Kentner PT [phone]
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
## 2017-01-21 NOTE — Progress Notes (Addendum)
PROGRESS NOTE    [name] DownerSherman E Mcconaughy  EAV:[id number] DOB: 11-Jul-1924 DOA: 01/20/2017 PCP: Gaspar Garbeisovec, Richard W, MD   Brief Narrative: [name] [name] is a 83 y.o. male with history of memory loss, hypertension, asthma, severe peripheral arterial disease who presents with progressive weakness and confusion. Treating for sepsis secondary to osteomyelitis.   Assessment & Plan:   Principal Problem:   Sepsis (HCC) Active Problems:   Essential hypertension   PVD   COPD (chronic obstructive pulmonary disease) (HCC)   Foot ulcer (HCC)   Hypernatremia   Rhabdomyolysis   AKI (acute kidney injury) (HCC)   Sepsis secondary to cellulitis Osteomyelitis Per admission notes, daughter does not want amputation. Only other option would be long term antibiotics vs hospice -continue vancomycin/ceftriaxone/metronidazole -blood cultures pending -ID consult for possible long term antibiotics pending discussion with family with regard to goals of care  Hypernatremia Worsened. -start hypotonic saline, 1/2NS @100ml /hr  Acute metabolic encephalopathy Appears possibly resolved. Unknown baseline. -continue antibiotics as above  Falls Generalized weakness -PT/OT eval  Dysphagia -SLP eval pending -NPO until SLP evaluation  Dementia Per history. Patient oriented to self only.  PAD Family not wanting surgeries/procedures  Essential hypertension -continue hydralazine prn  COPD Asymptomatic. Stable. -continue albuterol prn  Rhabdomyolysis Improving with IV fluids   DVT prophylaxis: Lovenox Code Status: DNR/DNI Family Communication: None at bedside. Attempted to call daughter without success Disposition Plan: Discharge pending goals of care   Consultants:   Palliative care medicine  Procedures:   None  Antimicrobials:  Vancomycin  Ceftriaxone  Metronidazole   Left hip pain -x-ray   Subjective: Patient reports some mild left hip pain. No foot  pain.  Objective: Vitals:   01/20/17 1622 01/20/17 2127 01/21/17 0518 01/21/17 0815  BP: (!) 140/98 126/80 (!) 120/54 (!) 152/77  Pulse: 99 88 88 87  Resp: 16 16 16 16   Temp: 98.1 F (36.7 C) 97.7 F (36.5 C) 97.6 F (36.4 C) 97.9 F (36.6 C)  TempSrc: Oral Axillary Axillary Oral  SpO2: 95% 95% 99% 97%  Weight:      Height:        Intake/Output Summary (Last 24 hours) at 01/21/17 1157 Last data filed at 01/21/17 0300  Gross per 24 hour  Intake             2950 ml  Output                0 ml  Net             2950 ml   Filed Weights   01/20/17 1046  Weight: 83.9 kg (185 lb)    Examination:  General exam: Appears calm and comfortable  Respiratory system: Clear to auscultation. Respiratory effort normal. Cardiovascular system: S1 & S2 heard, RRR. No murmurs. Gastrointestinal system: Abdomen is nondistended, soft and nontender. Normal bowel sounds heard. Central nervous system: Alert and oriented to person only. No focal neurological deficits. Extremities: No edema. No calf tenderness. No inguinal tenderness with some mild left lateral hip tenderness. Left foot with clean gauze Skin: No cyanosis. Psychiatry: Judgement and insight appear impaired. Mood & affect appropriate. Memory impaired.    Data Reviewed: I have personally reviewed following labs and imaging studies  CBC:  Recent Labs Lab 01/20/17 1119 01/21/17 0633  WBC 14.8* 11.5*  HGB 17.8* 15.3  HCT 54.2* 47.9  MCV 92.0 94.3  PLT 291 201   Basic Metabolic Panel:  Recent Labs Lab 01/20/17 1119 01/21/17 0633  NA 150*  151*  K 4.8 4.1  CL 107 117*  CO2 26 23  GLUCOSE 143* 94  BUN 67* 52*  CREATININE 2.17* 1.49*  CALCIUM 9.2 8.6*   GFR: Estimated Creatinine Clearance: 36.2 mL/min (A) (by C-G formula based on SCr of 1.49 mg/dL (H)). Liver Function Tests:  Recent Labs Lab 01/20/17 1119  AST 92*  ALT 35  ALKPHOS 79  BILITOT 1.0  PROT 7.7  ALBUMIN 2.9*   No results for input(s): LIPASE,  AMYLASE in the last 168 hours. No results for input(s): AMMONIA in the last 168 hours. Coagulation Profile: No results for input(s): INR, PROTIME in the last 168 hours. Cardiac Enzymes:  Recent Labs Lab 01/20/17 1119 01/21/17 0633  CKTOTAL 2,166* 1,053*   BNP (last 3 results) No results for input(s): PROBNP in the last 8760 hours. HbA1C:  Recent Labs  01/20/17 1626  HGBA1C 5.5   CBG: No results for input(s): GLUCAP in the last 168 hours. Lipid Profile: No results for input(s): CHOL, HDL, LDLCALC, TRIG, CHOLHDL, LDLDIRECT in the last 72 hours. Thyroid Function Tests:  Recent Labs  01/21/17 0633  TSH 1.234   Anemia Panel:  Recent Labs  01/21/17 4540  VITAMINB12 2,055*   Sepsis Labs:  Recent Labs Lab 01/20/17 1321 01/20/17 1650  LATICACIDVEN 2.97* 1.7    Recent Results (from the past 240 hour(s))  Urine culture     Status: Abnormal (Preliminary result)   Collection Time: 01/20/17 11:19 AM  Result Value Ref Range Status   Specimen Description URINE, CLEAN CATCH  Final   Special Requests NONE  Final   Culture (A)  Final    >=100,000 COLONIES/mL STAPHYLOCOCCUS AUREUS SUSCEPTIBILITIES TO FOLLOW Performed at [facility], [address], [place], [place] [address]    Report Status PENDING  Incomplete         Radiology Studies: Ct Head Wo Contrast  Result Date: 01/20/2017 CLINICAL DATA:  Unwitnessed fall last night. EXAM: CT HEAD WITHOUT CONTRAST TECHNIQUE: Contiguous axial images were obtained from the base of the skull through the vertex without intravenous contrast. COMPARISON:  CT scan of September 22, 2015. FINDINGS: Brain: Mild diffuse cortical atrophy is noted No mass effect or midline shift is noted. Ventricular size is within normal limits. There is no evidence of mass lesion, hemorrhage or acute infarction. Vascular: Atherosclerosis of carotid siphons is noted. Skull: Normal. Negative for fracture or focal lesion. Sinuses/Orbits: No acute  finding. Other: None. IMPRESSION: Mild diffuse cortical atrophy. No acute intracranial abnormality seen. Electronically Signed   By: Lupita Raider, M.D.   On: 01/20/2017 12:17   Dg Foot Complete Left  Result Date: 01/20/2017 CLINICAL DATA:  Left great toe ulcer. No known injury. Known peripheral vascular disease, former smoker. EXAM: LEFT FOOT - COMPLETE 3+ VIEW COMPARISON:  None in PACs FINDINGS: There is bony resorption of the entire distal phalanx of the second toe. There is partial resorption of the distal phalanx of the great toe as well as lateral subluxation of the base of the distal phalanx with respect to the head of the proximal phalanx of the great toe. The other phalanges appear intact. There is chronic bony resorption of the head of the second metatarsal and a portion of the base of the proximal phalanx of the second toe. There are vascular calcifications. There are plantar and Achilles region calcaneal spurs. No tarsal bone abnormalities are observed. IMPRESSION: Findings worrisome for osteomyelitis of the great toe and second toe distally. Probable chronic arthropathic  change of the second MTP joint. Electronically Signed   By: David  [place] M.D.   On: 01/20/2017 15:33        Scheduled Meds: . enoxaparin (LOVENOX) injection  30 mg Subcutaneous Q24H  . feeding supplement (ENSURE ENLIVE)  237 mL Oral BID BM  . nystatin  5 mL Oral QID   Continuous Infusions: . sodium chloride 100 mL/hr at 01/21/17 0848  . cefTRIAXone (ROCEPHIN)  IV Stopped (01/21/17 1014)   And  . metronidazole Stopped (01/21/17 0851)  . vancomycin Stopped (01/20/17 1937)     LOS: 1 day     Jacquelin Hawking, MD [facility] 01/21/2017, 11:57 AM Pager: [phone]  If 7PM-7AM, please contact night-coverage [email or web address] Password [email or web address] 01/21/2017, 11:57 AM

## 2017-01-21 NOTE — Evaluation (Signed)
Clinical/Bedside Swallow Evaluation Patient Details  Name: [name] [name] MRN: [id number] Date of Birth: Mar 16, 1924  Today's Date: 01/21/2017 Time: SLP Start Time (ACUTE ONLY): 1030 SLP Stop Time (ACUTE ONLY): 1105 SLP Time Calculation (min) (ACUTE ONLY): 35 min  Past Medical History:  Past Medical History:  Diagnosis Date  . Arthritis   . Asthma   . Diverticulosis of colon   . Dizziness   . HTN (hypertension)   . PAD (peripheral artery disease) (HCC)    severe bilateral lower ext disease. Runoff 1/12 per Cr. Chen with severe disease R SFA and below the knee   Past Surgical History:  Past Surgical History:  Procedure Laterality Date  . APPENDECTOMY    . exploration debridement     repair of Achilles Tendon repture, right  . perforated large intestines    . right carotic endarentomy  2005  . TONSILLECTOMY     HPI:  83 yo male adm [facility] after falling at home.  PMH + for memory deficits, dizziness, PAD, arthritis, asthma.  Pt with poor intake per 1 week per review of MD notes.  Swallow evaluation ordered.  No CXR done with this admit   Assessment / Plan / Recommendation Clinical Impression  Pt presents with sign sof oral dysphagia with possible pharyngeal component.  Anterior loss of soda left labia noted  - pt also slightly leaning left.  He consumes large sequential boluses despite cues to take small boluses.  OVert coughing noted immediately postswallow of thin - which was not eliminated with smaller boluses *uncertain if larynx already infiltrated.  Pt with prolonged oral transiting/"mastication" of graham cracker with puree -with mild residuals.  Pt reports he only eats with dentures, requested RN to phone family for teeth/adhesive.  Using teach back, educated pt to findings/recommendations  and he verbalized thicker liquids made him cough less during our evaluation. - Recommend dys1/nectar and ice chips - advancing to dys2 when dentures brought to hospital.  Will follow for  readiness for dietary advancement.  Pt does admit to premorbid getting "strangled"on coffee and liquids if he drinks too fast causing SLP to suspect component of chronic deficits.   SLP Visit Diagnosis: Dysphagia, oropharyngeal phase (R13.12)    Aspiration Risk  Moderate aspiration risk    Diet Recommendation Nectar-thick liquid;Ice chips PRN after oral care;Dysphagia 1 (Puree) (advance to dys2 with dentures)   Liquid Administration via: Cup;No straw Medication Administration: Whole meds with puree Supervision: Patient able to self feed;Intermittent supervision to cue for compensatory strategies Compensations: Slow rate;Small sips/bites Postural Changes: Seated upright at 90 degrees;Remain upright for at least 30 minutes after po intake    Other  Recommendations Oral Care Recommendations: Oral care BID   Follow up Recommendations        Frequency and Duration min 1 x/week  1 week       Prognosis Prognosis for Safe Diet Advancement: Good Barriers to Reach Goals: Time post onset      Swallow Study   General Date of Onset: 01/21/17 HPI: 83 yo male adm to [facility] after falling at home.  PMH + for memory deficits, dizziness, PAD, arthritis, asthma.  Pt with poor intake per 1 week per review of MD notes.  Swallow evaluation ordered.  No CXR done with this admit Type of Study: Bedside Swallow Evaluation Diet Prior to this Study: NPO Temperature Spikes Noted: No Respiratory Status: Room air History of Recent Intubation: No Behavior/Cognition: Alert;Cooperative;Pleasant mood (? HOH) Oral Cavity Assessment: Dry;Edema;Erythema Oral Care  Completed by SLP: Yes Oral Cavity - Dentition: Dentures, not available (pt reports he wears his dentures to eat but not at hospital, advised RN to call if she can to get dentures in for pt) Vision: Functional for self-feeding Self-Feeding Abilities: Able to feed self Patient Positioning: Upright in bed Baseline Vocal Quality: Low vocal  intensity;Hoarse Volitional Cough: Weak Volitional Swallow: Unable to elicit    Oral/Motor/Sensory Function Overall Oral Motor/Sensory Function: Generalized oral weakness   Ice Chips Ice chips: Impaired Presentation: Spoon Oral Phase Impairments: Reduced labial seal Oral Phase Functional Implications: Prolonged oral transit Pharyngeal Phase Impairments: Suspected delayed Swallow   Thin Liquid Thin Liquid: Impaired Presentation: Cup;Straw Oral Phase Impairments: Reduced labial seal Oral Phase Functional Implications: Other (comment);Left anterior spillage (suspect premature spillage of boluses into pharynx) Pharyngeal  Phase Impairments: Cough - Immediate;Throat Clearing - Immediate    Nectar Thick Nectar Thick Liquid: Within functional limits Presentation: Cup;Self Fed;Spoon   Honey Thick Honey Thick Liquid: Not tested   Puree Puree: Within functional limits Presentation: Self Fed;Spoon   Solid   GO   Solid: Impaired Presentation: Self Fed Oral Phase Impairments: Reduced lingual movement/coordination;Impaired mastication Oral Phase Functional Implications: Prolonged oral transit;Oral holding;Oral residue Other Comments: pt reports he only eats with his dentures in place but admitted boluses melted in mouth to allow swallow        Donavan Burnetamara Jolee Critcher, MS [facility] SLP [phone]

## 2017-01-21 NOTE — Progress Notes (Signed)
Initial Nutrition Assessment  DOCUMENTATION CODES:   Severe malnutrition in context of chronic illness  INTERVENTION:   Ensure Enlive po BID, each supplement provides 350 kcal and 20 grams of protein  Recommend MVI to encourage wound healing if pt able to tolerate   NUTRITION DIAGNOSIS:   Malnutrition (severe) related to chronic illness (COPD, dementia, advanced age) as evidenced by severe depletion of body fat, severe depletion of muscle mass.  GOAL:   Patient will meet greater than or equal to 90% of their needs  MONITOR:   PO intake, Supplement acceptance, Labs, Weight trends, Skin  REASON FOR ASSESSMENT:   Consult Wound healing  ASSESSMENT:    83 y.o. male with history of memory loss, hypertension, asthma, severe peripheral arterial disease who presents with progressive weakness and confusion.    Met with pt in room today. Pt is a poor historian but reports that he eats well at home and drinks chocolate Ensure. Pt initiated on DYS 1/nectar diet today. Per chart, pt is weight stable. Encourage intake of meals and supplements to promote wound healing. Pt would benefit from MVI if he is able to tolerate taking it.   Medications reviewed and include: lovenox, ceftriaxone, metronidazole, vancomycin  Labs reviewed: Na 151(H), Cl 117(H), BUN 52(H), creat 1.49(H), Ca 8.6(L), CK 1053(H) Wbc- 11.5(H)  Nutrition-Focused physical exam completed. Findings are severe muscle and fat depletions over entire body, and no edema  Diet Order:  DIET - DYS 1 Room service appropriate? Yes; Fluid consistency: Nectar Thick  Skin:  Wound (see comment) (ankle and toe)  Last BM:  none since admit  Height:   Ht Readings from Last 1 Encounters:  01/20/17 6' (1.829 m)    Weight:   Wt Readings from Last 1 Encounters:  01/20/17 185 lb (83.9 kg)    Ideal Body Weight:  80.9 kg  BMI:  Body mass index is 25.09 kg/m.  Estimated Nutritional Needs:   Kcal:  2200-2500kcal/day    Protein:  126-143g/day   Fluid:  >2L/day   EDUCATION NEEDS:   Education needs no appropriate at this time  Koleen Distance MS, RD, LDN Pager #- [phone]

## 2017-01-21 NOTE — Progress Notes (Signed)
PHARMACY - PHYSICIAN COMMUNICATION CRITICAL VALUE ALERT - BLOOD CULTURE IDENTIFICATION (BCID)  Results for orders placed or performed during the hospital encounter of 01/20/17  Blood Culture ID Panel (Reflexed) (Collected: 01/20/2017  1:15 PM)  Result Value Ref Range   Enterococcus species NOT DETECTED NOT DETECTED   Listeria monocytogenes NOT DETECTED NOT DETECTED   Staphylococcus species DETECTED (A) NOT DETECTED   Staphylococcus aureus DETECTED (A) NOT DETECTED   Methicillin resistance NOT DETECTED NOT DETECTED   Streptococcus species NOT DETECTED NOT DETECTED   Streptococcus agalactiae NOT DETECTED NOT DETECTED   Streptococcus pneumoniae NOT DETECTED NOT DETECTED   Streptococcus pyogenes NOT DETECTED NOT DETECTED   Acinetobacter baumannii NOT DETECTED NOT DETECTED   Enterobacteriaceae species NOT DETECTED NOT DETECTED   Enterobacter cloacae complex NOT DETECTED NOT DETECTED   Escherichia coli NOT DETECTED NOT DETECTED   Klebsiella oxytoca NOT DETECTED NOT DETECTED   Klebsiella pneumoniae NOT DETECTED NOT DETECTED   Proteus species NOT DETECTED NOT DETECTED   Serratia marcescens NOT DETECTED NOT DETECTED   Haemophilus influenzae NOT DETECTED NOT DETECTED   Neisseria meningitidis NOT DETECTED NOT DETECTED   Pseudomonas aeruginosa NOT DETECTED NOT DETECTED   Candida albicans NOT DETECTED NOT DETECTED   Candida glabrata NOT DETECTED NOT DETECTED   Candida krusei NOT DETECTED NOT DETECTED   Candida parapsilosis NOT DETECTED NOT DETECTED   Candida tropicalis NOT DETECTED NOT DETECTED    Name of physician (or Provider) Contacted: K Schorr  Changes to prescribed antibiotics required: No change in therapy at this time.  Aleene DavidsonGrimsley Jr, Thea Holshouser Crowford 01/21/2017  11:38 PM

## 2017-01-21 NOTE — Progress Notes (Signed)
Pharmacy Antibiotic Note  [name] [name] is a 83 y.o. male admitted on 01/20/2017 s/[name] fall at home.  Patient's currently on vancomycin, ceftriaxone and flagyl for L foot wound and suspected osteomyelitis.  SCr has improved to 1.49 (crcl~36)   Plan: - increase vancomycin dose to 1gm IV q24h - continue ceftriaxone 2gm IV q24h and flagyl 500 mg q8h - f/u renal function and goals of care  ______________________________  Height: 6' (182.9 cm) Weight: 185 lb (83.9 kg) IBW/kg (Calculated) : 77.6  Temp (24hrs), Avg:97.8 F (36.6 C), Min:97.6 F (36.4 C), Max:98.1 F (36.7 C)   Recent Labs Lab 01/20/17 1119 01/20/17 1321 01/20/17 1650 01/21/17 0633  WBC 14.8*  --   --  11.5*  CREATININE 2.17*  --   --  1.49*  LATICACIDVEN  --  2.97* 1.7  --     Estimated Creatinine Clearance: 36.2 mL/min (A) (by C-G formula based on SCr of 1.49 mg/dL (H)).    No Known Allergies   Thank you for allowing pharmacy to be a part of this patient's care.  [name] [name], [name] [name] 01/21/2017 2:13 PM

## 2017-01-21 NOTE — Progress Notes (Signed)
OT Cancellation Note  Patient Details Name: [name] DownerSherman E Fletes MRN: [id number] DOB: Sep 04, 1923   Cancelled Treatment:    Reason Eval/Treat Not Completed: Other (comment)  Pt eating lunch asking if he owed anything. Role of OT explained and pt thinks there is a cost and refused at this time. Will check  Back on pt later in day or next day.  Lise AuerLori Monia Timmers, [place]OT [phone]  Einar CrowEDDING, Nazeer Romney D 01/21/2017, 1:10 PM

## 2017-01-21 NOTE — Care Management Note (Signed)
Case Management Note  Patient Details  Name: [name] [name] MRN: [id number] Date of Birth: 02/17/1924  Subjective/Objective:                  83 y.o. male with history of memory loss, hypertension, asthma, severe peripheral arterial disease who presents with progressive weakness and confusion. For the last several months, the patient has not been able to perform all of his ADLs and has been more confused. He lives alone and family occasionally checks on him. For the last week, he has been even more weak than usual and he was found on the floor by his daughter and son-in-law. He was crawling around his house to get to what he needed. He refused to come to the emergency department. The patient is too lethargic to contribute to history but the daughter states that he has been having some sweating for the last few days suggestive of fever. She has not noticed any signs of sinus congestion or cough. He has not complained of any pains. Despite lying on the floor for the last couple of days there has been no obvious emesis or diarrhea.  The daughter states he has not eaten or drank anything for about a week. When they try to offer her something to drink it fell out of his mouth. They did not offer him anything to eat yesterday.  ED Course: Vital signs afebrile, heart rate in the 90s to low 100s, blood pressure mildly elevated, stable on room air.  Labs:  Debbie BC 14.8, hemoglobin 17.8.  Na 150, creatinine 2.17, BUN 67.  Lactic acid 2.97.  CK 2166.  Urinalysis was positive.  Head CT demonstrate no acute change. X-ray of the left foot demonstrates evidence of osteomyelitis of the first and second toes.  Chest x-ray not completed secondary to patient not having any symptoms and already on broad-spectrum antibiotics.    Action/Plan:  Home alone Date:  January 21, 2017  Chart reviewed for concurrent status and case management needs.  Will continue to follow patient progress.  Discharge Planning: following for  needs  Expected discharge date: [id number]  Marcelle SmilingRhonda Natayah Warmack, BSN, [place]roadRN3, [place]CCM   [phone]   Expected Discharge Date:   (unknown)               Expected Discharge Plan:  Home/Self Care  In-House Referral:     Discharge planning Services  CM Consult  Post Acute Care Choice:    Choice offered to:     DME Arranged:    DME Agency:     HH Arranged:    HH Agency:     Status of Service:  In process, will continue to follow  If discussed at Long Length of Stay Meetings, dates discussed:    Additional Comments:  Golda AcreDavis, Keenon Leitzel Lynn, RN 01/21/2017, 9:24 AM

## 2017-01-22 DIAGNOSIS — R413 Other amnesia: Secondary | ICD-10-CM

## 2017-01-22 DIAGNOSIS — Z8379 Family history of other diseases of the digestive system: Secondary | ICD-10-CM

## 2017-01-22 DIAGNOSIS — M86672 Other chronic osteomyelitis, left ankle and foot: Secondary | ICD-10-CM

## 2017-01-22 DIAGNOSIS — M869 Osteomyelitis, unspecified: Secondary | ICD-10-CM | POA: Diagnosis present

## 2017-01-22 DIAGNOSIS — Z87891 Personal history of nicotine dependence: Secondary | ICD-10-CM

## 2017-01-22 DIAGNOSIS — E43 Unspecified severe protein-calorie malnutrition: Secondary | ICD-10-CM | POA: Diagnosis present

## 2017-01-22 DIAGNOSIS — E440 Moderate protein-calorie malnutrition: Secondary | ICD-10-CM

## 2017-01-22 DIAGNOSIS — R7881 Bacteremia: Secondary | ICD-10-CM | POA: Diagnosis present

## 2017-01-22 DIAGNOSIS — E860 Dehydration: Secondary | ICD-10-CM

## 2017-01-22 LAB — CBC WITH DIFFERENTIAL/PLATELET
Basophils Absolute: 0 10*3/uL (ref 0.0–0.1)
Basophils Relative: 0 %
Eosinophils Absolute: 0 10*3/uL (ref 0.0–0.7)
Eosinophils Relative: 0 %
HCT: 45.2 % (ref 39.0–52.0)
Hemoglobin: 14.3 g/dL (ref 13.0–17.0)
Lymphocytes Relative: 12 %
Lymphs Abs: 1.2 10*3/uL (ref 0.7–4.0)
MCH: 29.9 pg (ref 26.0–34.0)
MCHC: 31.6 g/dL (ref 30.0–36.0)
MCV: 94.4 fL (ref 78.0–100.0)
Monocytes Absolute: 0.2 10*3/uL (ref 0.1–1.0)
Monocytes Relative: 2 %
Neutro Abs: 8.5 10*3/uL — ABNORMAL HIGH (ref 1.7–7.7)
Neutrophils Relative %: 86 %
Platelets: 227 10*3/uL (ref 150–400)
RBC: 4.79 MIL/uL (ref 4.22–5.81)
RDW: 14.6 % (ref 11.5–15.5)
WBC: 9.9 10*3/uL (ref 4.0–10.5)

## 2017-01-22 LAB — BASIC METABOLIC PANEL
ANION GAP: 7 (ref 5–15)
BUN: 40 mg/dL — ABNORMAL HIGH (ref 6–20)
BUN: 40 — AB (ref 4–21)
CHLORIDE: 117 mmol/L — AB (ref 101–111)
CO2: 28 mmol/L (ref 22–32)
Calcium: 8.4 mg/dL — ABNORMAL LOW (ref 8.9–10.3)
Creatinine, Ser: 1.35 mg/dL — ABNORMAL HIGH (ref 0.61–1.24)
Creatinine: 1.4 — AB (ref 0.6–1.3)
GFR calc non Af Amer: 45 mL/min — ABNORMAL LOW (ref >60)
GFR, EST AFRICAN AMERICAN: 52 mL/min — AB (ref >60)
Glucose, Bld: 107 mg/dL — ABNORMAL HIGH (ref 65–99)
Glucose: 107
POTASSIUM: 3.3 mmol/L — AB (ref 3.5–5.1)
Potassium: 3.3 — AB (ref 3.4–5.3)
SODIUM: 152 mmol/L — AB (ref 135–145)
Sodium: 152 — AB (ref 137–147)

## 2017-01-22 LAB — URINE CULTURE

## 2017-01-22 LAB — RPR: RPR Ser Ql: NONREACTIVE

## 2017-01-22 LAB — CBC AND DIFFERENTIAL
HEMATOCRIT: 45 (ref 41–53)
HEMOGLOBIN: 14.3 (ref 13.5–17.5)
PLATELETS: 227 (ref 150–399)
WBC: 9.9

## 2017-01-22 LAB — CK: Total CK: 436 U/L — ABNORMAL HIGH (ref 49–397)

## 2017-01-22 MED ORDER — POTASSIUM CHLORIDE CRYS ER 20 MEQ PO TBCR
40.0000 meq | EXTENDED_RELEASE_TABLET | Freq: Once | ORAL | Status: AC
  Administered 2017-01-22: 40 meq via ORAL
  Filled 2017-01-22: qty 2

## 2017-01-22 MED ORDER — DEXTROSE 5 % IV SOLN
INTRAVENOUS | Status: DC
  Administered 2017-01-22 – 2017-01-23 (×2): via INTRAVENOUS
  Filled 2017-01-22 (×3): qty 1000

## 2017-01-22 MED ORDER — POTASSIUM CHLORIDE 20 MEQ PO PACK: 40.0000 meq | PACK | Freq: Once | ORAL | Status: DC

## 2017-01-22 MED ORDER — SODIUM CHLORIDE 0.9 % IV SOLN
3.0000 g | Freq: Four times a day (QID) | INTRAVENOUS | Status: DC
  Administered 2017-01-22 – 2017-01-25 (×12): 3 g via INTRAVENOUS
  Filled 2017-01-22 (×14): qty 3

## 2017-01-22 NOTE — Progress Notes (Signed)
Pharmacy Antibiotic Note  [name] [name] is a 83 y.o. male admitted on 01/20/2017 s/p fall at home.  Patient's currently on vancomycin, ceftriaxone and flagyl for L foot wound and suspected osteomyelitis.   Today, 01/22/2017:  Scr continues to improve  Afebrile, WBC normalized  Narrowing to Unasyn with MSSA in blood/urine, and concern for osteomyelitis of toes   Plan:  Unasyn 3g IV q6 hr  F/u renal function, ID recommendations  ______________________________  Height: 6' (182.9 cm) Weight: 185 lb (83.9 kg) IBW/kg (Calculated) : 77.6  Temp (24hrs), Avg:98 F (36.7 C), Min:97.5 F (36.4 C), Max:98.6 F (37 C)   Recent Labs Lab 01/20/17 1119 01/20/17 1321 01/20/17 1650 01/21/17 0633 01/22/17 1002  WBC 14.8*  --   --  11.5* 9.9  CREATININE 2.17*  --   --  1.49* 1.35*  LATICACIDVEN  --  2.97* 1.7  --   --     Estimated Creatinine Clearance: 39.9 mL/min (A) (by C-G formula based on SCr of 1.35 mg/dL (H)).    No Known Allergies  Antimicrobials this admission:  6/4 Rocephin >> 6/6 6/4 Flagyl >> 6/6 6/4 Vanc >> 6/6 6/6 Unasyn >>  Dose adjustments this admission:  ---  Microbiology results:  6/4 BCx x2: 1/2 (1/2 bottles) S aureus (expect MSSA given UCx) 6/4 UCx: >100K MSSA  Thank you for allowing pharmacy to be a part of this patient's care.  Bernadene Personrew Eryc Bodey, PharmD, BCPS Pager: [phone] 01/22/2017, 11:43 AM

## 2017-01-22 NOTE — Progress Notes (Addendum)
PROGRESS NOTE    [name] [name]  ZOX:[id number] DOB: 12/19/1923 DOA: 01/20/2017 PCP: Gaspar Garbe, MD    Brief Narrative:  [name] [name] is a 83 y.o. malewith history of memory loss, hypertension, asthma, severe peripheral arterial disease who presents with progressive weakness and confusion. 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    Assessment & Plan:   Principal Problem:   Staphylococcus aureus bacteremia Active Problems:   Essential hypertension   PVD   COPD (chronic obstructive pulmonary disease) (HCC)   Sepsis (HCC)   Foot ulcer (HCC)   Hypernatremia   Rhabdomyolysis   AKI (acute kidney injury) (HCC)   Protein-calorie malnutrition, severe   Osteomyelitis of toe of left foot (HCC)   Memory loss  Sepsis secondary to osteomyelitis of the toes and MSSA bacteremia. Patient noted to have osteomyelitis of the great toe and second toe distally as well as probable second MTP joint. Plain films of the left foot 01/20/2017. Blood cultures positive for MSSA bacteremia. Patient with some clinical improvement. Afebrile. Leukocytosis improved. Family and patient not interested in amputation at this time. Check 2 d echo.  Patient has been seen in consultation by ID who now with IV antibiotics to IV Unasyn. ID following. Will also consult with palliative care for goals of care.   Hypernatremia Worsened. Will change IV fluids to D5W at 100 mL per hour. Follow.  Acute metabolic encephalopathy Likely secondary to chronic osteomyelitis of toes of the foot in addition to a MSSA bacteremia. Patient with clinical improvement and likely close to baseline. Continue hydration. IV antibiotics have been narrowed to Unasyn per ID. Follow. Supportive care.   Falls Generalized weakness -PT/OT eval  Dysphagia -Patient has an assessed by speech therapy and patient  placed on a dysphagia 2 diet with nectar thick liquids. Outpatient follow-up.   Dementia Per history. Patient oriented to self and improvement with mental status since admission.   PAD Family not wanting surgeries/procedures.  Essential hypertension -continue hydralazine prn  COPD Asymptomatic. Stable. -continue albuterol prn  Rhabdomyolysis Improved with hydration.    DVT prophylaxis: Lovenox Code Status: DO NOT RESUSCITATE Family Communication: Updated patient and family at bedside. Disposition Plan: Home with home health versus skilled nursing facility pending clinical outcome.   Consultants:   Infectious disease: Dr. Orvan Falconer 01/22/2017  Palliative care pending  Procedures:   CT head 01/20/2017  Plain films left foot 01/20/2017  Plain films of The left hip 01/21/2017  Antimicrobials:   IV Unasyn 01/22/2017  IV Rocephin 01/20/2017>>> 01/22/2017  IV Flagyl 01/20/2017>>>> 01/22/2017  IV vancomycin 01/20/2017>>>>> 01/22/2017   Subjective: Patient sitting up in chair. No shortness of breath no chest pain. Patient states he's feeling better. Patient tolerating current diet.  Objective: Vitals:   01/21/17 0815 01/21/17 1313 01/21/17 2106 01/22/17 0608  BP: (!) 152/77 (!) 113/58 (!) 110/55 (!) 144/68  Pulse: 87 80 79 82  Resp: 16 16 16 16   Temp: 97.9 F (36.6 C) 97.8 F (36.6 C) 98.6 F (37 C) 97.5 F (36.4 C)  TempSrc: Oral Oral Axillary Oral  SpO2: 97% 98% 98% 100%  Weight:      Height:        Intake/Output Summary (Last 24 hours) at 01/22/17 1307 Last data filed at 01/22/17 0920  Gross per 24 hour  Intake          2946.67 ml  Output  500 ml  Net          2446.67 ml   Filed Weights   01/20/17 1046  Weight: 83.9 kg (185 lb)    Examination:  General exam: Appears calm and comfortable  Respiratory system: Clear to auscultation. Respiratory effort normal. Cardiovascular system: S1 & S2 heard, RRR. No JVD, murmurs, rubs,  gallops or clicks. No pedal edema. Gastrointestinal system: Abdomen is nondistended, soft and nontender. No organomegaly or masses felt. Normal bowel sounds heard. Central nervous system: Alert and oriented. No focal neurological deficits. Extremities: Left lateral hip tender with wound noted. Left foot wrapped in gauze with a bad odor. Skin: No rashes, lesions or ulcers Psychiatry: Judgement and insight appear fair. Mood & affect appropriate.     Data Reviewed: I have personally reviewed following labs and imaging studies  CBC:  Recent Labs Lab 01/20/17 1119 01/21/17 0633 01/22/17 1002  WBC 14.8* 11.5* 9.9  NEUTROABS  --   --  8.5*  HGB 17.8* 15.3 14.3  HCT 54.2* 47.9 45.2  MCV 92.0 94.3 94.4  PLT 291 201 227   Basic Metabolic Panel:  Recent Labs Lab 01/20/17 1119 01/21/17 0633 01/22/17 1002  NA 150* 151* 152*  K 4.8 4.1 3.3*  CL 107 117* 117*  CO2 26 23 28   GLUCOSE 143* 94 107*  BUN 67* 52* 40*  CREATININE 2.17* 1.49* 1.35*  CALCIUM 9.2 8.6* 8.4*   GFR: Estimated Creatinine Clearance: 39.9 mL/min (A) (by C-G formula based on SCr of 1.35 mg/dL (H)). Liver Function Tests:  Recent Labs Lab 01/20/17 1119  AST 92*  ALT 35  ALKPHOS 79  BILITOT 1.0  PROT 7.7  ALBUMIN 2.9*   No results for input(s): LIPASE, AMYLASE in the last 168 hours. No results for input(s): AMMONIA in the last 168 hours. Coagulation Profile: No results for input(s): INR, PROTIME in the last 168 hours. Cardiac Enzymes:  Recent Labs Lab 01/20/17 1119 01/21/17 0633 01/22/17 0719  CKTOTAL 2,166* 1,053* 436*   BNP (last 3 results) No results for input(s): PROBNP in the last 8760 hours. HbA1C:  Recent Labs  01/20/17 1626  HGBA1C 5.5   CBG: No results for input(s): GLUCAP in the last 168 hours. Lipid Profile: No results for input(s): CHOL, HDL, LDLCALC, TRIG, CHOLHDL, LDLDIRECT in the last 72 hours. Thyroid Function Tests:  Recent Labs  01/21/17 0633  TSH 1.234   Anemia  Panel:  Recent Labs  01/21/17 [id number]  VITAMINB12 2,055*   Sepsis Labs:  Recent Labs Lab 01/20/17 1321 01/20/17 1650  LATICACIDVEN 2.97* 1.7    Recent Results (from the past 240 hour(s))  Urine culture     Status: Abnormal   Collection Time: 01/20/17 11:19 AM  Result Value Ref Range Status   Specimen Description URINE, CLEAN CATCH  Final   Special Requests NONE  Final   Culture >=100,000 COLONIES/mL STAPHYLOCOCCUS AUREUS (A)  Final   Report Status 01/22/2017 FINAL  Final   Organism ID, Bacteria STAPHYLOCOCCUS AUREUS (A)  Final      Susceptibility   Staphylococcus aureus - MIC*    CIPROFLOXACIN <=0.5 SENSITIVE Sensitive     GENTAMICIN <=0.5 SENSITIVE Sensitive     NITROFURANTOIN <=16 SENSITIVE Sensitive     OXACILLIN 0.5 SENSITIVE Sensitive     TETRACYCLINE <=1 SENSITIVE Sensitive     VANCOMYCIN <=0.5 SENSITIVE Sensitive     TRIMETH/SULFA <=10 SENSITIVE Sensitive     CLINDAMYCIN <=0.25 SENSITIVE Sensitive     RIFAMPIN <=0.5 SENSITIVE Sensitive  Inducible Clindamycin NEGATIVE Sensitive     * >=100,000 COLONIES/mL STAPHYLOCOCCUS AUREUS  Blood Culture (routine x 2)     Status: None (Preliminary result)   Collection Time: 01/20/17  1:15 PM  Result Value Ref Range Status   Specimen Description BLOOD LEFT HAND  Final   Special Requests IN PEDIATRIC BOTTLE Blood Culture adequate volume  Final   Culture   Final    NO GROWTH < 24 HOURS Performed at [facility], [address], [place], [place] [address]    Report Status PENDING  Incomplete  Blood Culture (routine x 2)     Status: None (Preliminary result)   Collection Time: 01/20/17  1:15 PM  Result Value Ref Range Status   Specimen Description BLOOD RIGHT HAND  Final   Special Requests IN PEDIATRIC BOTTLE Blood Culture adequate volume  Final   Culture  Setup Time   Final    GRAM POSITIVE COCCI IN CLUSTERS IN PEDIATRIC BOTTLE Organism ID to follow CRITICAL RESULT CALLED TO, READ BACK BY AND VERIFIED WITH: Peggyann Juba PHARMD [id number] 01/21/17 A BROWNING    Culture   Final    GRAM POSITIVE COCCI TOO YOUNG TO READ Performed at [facility], [address], [place], [place] [address]    Report Status PENDING  Incomplete  Blood Culture ID Panel (Reflexed)     Status: Abnormal   Collection Time: 01/20/17  1:15 PM  Result Value Ref Range Status   Enterococcus species NOT DETECTED NOT DETECTED Final   Listeria monocytogenes NOT DETECTED NOT DETECTED Final   Staphylococcus species DETECTED (A) NOT DETECTED Final    Comment: CRITICAL RESULT CALLED TO, READ BACK BY AND VERIFIED WITH: Peggyann Juba PHARMD [id number] 01/21/17 A BROWNING    Staphylococcus aureus DETECTED (A) NOT DETECTED Final    Comment: Methicillin (oxacillin) susceptible Staphylococcus aureus (MSSA). Preferred therapy is anti staphylococcal beta lactam antibiotic (Cefazolin or Nafcillin), unless clinically contraindicated. CRITICAL RESULT CALLED TO, READ BACK BY AND VERIFIED WITH: Peggyann Juba PHARMD [id number] 01/21/17 A BROWNING    Methicillin resistance NOT DETECTED NOT DETECTED Final   Streptococcus species NOT DETECTED NOT DETECTED Final   Streptococcus agalactiae NOT DETECTED NOT DETECTED Final   Streptococcus pneumoniae NOT DETECTED NOT DETECTED Final   Streptococcus pyogenes NOT DETECTED NOT DETECTED Final   Acinetobacter baumannii NOT DETECTED NOT DETECTED Final   Enterobacteriaceae species NOT DETECTED NOT DETECTED Final   Enterobacter cloacae complex NOT DETECTED NOT DETECTED Final   Escherichia coli NOT DETECTED NOT DETECTED Final   Klebsiella oxytoca NOT DETECTED NOT DETECTED Final   Klebsiella pneumoniae NOT DETECTED NOT DETECTED Final   Proteus species NOT DETECTED NOT DETECTED Final   Serratia marcescens NOT DETECTED NOT DETECTED Final   Haemophilus influenzae NOT DETECTED NOT DETECTED Final   Neisseria meningitidis NOT DETECTED NOT DETECTED Final   Pseudomonas aeruginosa NOT DETECTED NOT DETECTED Final   Candida albicans NOT  DETECTED NOT DETECTED Final   Candida glabrata NOT DETECTED NOT DETECTED Final   Candida krusei NOT DETECTED NOT DETECTED Final   Candida parapsilosis NOT DETECTED NOT DETECTED Final   Candida tropicalis NOT DETECTED NOT DETECTED Final    Comment: Performed at [facility], [address], [place], [place] [address]         Radiology Studies: Dg Foot Complete Left  Result Date: 01/20/2017 CLINICAL DATA:  Left great toe ulcer. No known injury. Known peripheral vascular disease, former smoker. EXAM: LEFT FOOT -  COMPLETE 3+ VIEW COMPARISON:  None in PACs FINDINGS: There is bony resorption of the entire distal phalanx of the second toe. There is partial resorption of the distal phalanx of the great toe as well as lateral subluxation of the base of the distal phalanx with respect to the head of the proximal phalanx of the great toe. The other phalanges appear intact. There is chronic bony resorption of the head of the second metatarsal and a portion of the base of the proximal phalanx of the second toe. There are vascular calcifications. There are plantar and Achilles region calcaneal spurs. No tarsal bone abnormalities are observed. IMPRESSION: Findings worrisome for osteomyelitis of the great toe and second toe distally. Probable chronic arthropathic change of the second MTP joint. Electronically Signed   By: David  [place] M.D.   On: 01/20/2017 15:33   Dg Hip Unilat With Pelvis 2-3 Views Left  Result Date: 01/21/2017 CLINICAL DATA:  Fall with left hip pain. EXAM: DG HIP (WITH OR WITHOUT PELVIS) 2-3V LEFT COMPARISON:  None. FINDINGS: No acute fracture or dislocation identified. There is osteoarthritis of the hip joint. Atherosclerotic calcifications are seen involving the femoral arteries. No bony lesions or destruction. IMPRESSION: No evidence of acute hip fracture. Osteoarthritis present of the left hip joint. Femoral atherosclerosis. Electronically Signed   By: Irish Lack M.D.   On:  01/21/2017 16:17        Scheduled Meds: . enoxaparin (LOVENOX) injection  30 mg Subcutaneous Q24H  . feeding supplement (ENSURE ENLIVE)  237 mL Oral BID BM  . nystatin  5 mL Oral QID  . potassium chloride  40 mEq Oral Once   Continuous Infusions: . ampicillin-sulbactam (UNASYN) IV    . dextrose 5 % 1,000 mL with potassium chloride 20 mEq infusion       LOS: 2 days    Time spent: 40 mins    Courteny Egler, MD [facility] Pager [phone]  If 7PM-7AM, please contact night-coverage [email or web address] Password [email or web address] 01/22/2017, 1:07 PM

## 2017-01-22 NOTE — Progress Notes (Deleted)
Date: January 22, 2017  Patient has bed at [facility] if md wants to dc to today/Dr. Karlton Lemonhompason made aware.  RNCM in touch with [facility] rep. Marcelle Smilinghonda Koron Godeaux, BSN, RN3, CCM:  [phone]

## 2017-01-22 NOTE — Consult Note (Signed)
[facility]    Date of Admission:  01/20/2017           Day 1 vancomycin        Day 1 ceftriaxone        Day 1 metronidazole       Reason for Consult: Automatic consultation for staph aureus bacteremia    Referring Physician: Dr. Ramiro Harvestaniel Thompson Primary Care Physician: Dr. Luan Mooreic Tisovec  Principal Problem:   Staphylococcus aureus bacteremia Active Problems:   Sepsis (HCC)   Osteomyelitis of toe of left foot (HCC)   Essential hypertension   PVD   COPD (chronic obstructive pulmonary disease) (HCC)   Foot ulcer (HCC)   Hypernatremia   Rhabdomyolysis   AKI (acute kidney injury) (HCC)   Protein-calorie malnutrition, severe   Memory loss   . enoxaparin (LOVENOX) injection  30 mg Subcutaneous Q24H  . feeding supplement (ENSURE ENLIVE)  237 mL Oral BID BM  . nystatin  5 mL Oral QID    Recommendations: 1. Consolidate antibiotic therapy to IV ampicillin sulbactam 2. Goals of care discussion with family   Assessment: [name] [name] has chronic osteomyelitis of his left first and second toes and possibly of his second metatarsal and MTP joint. This is complicated by MSSA bacteremia. I spoke with his daughter, [name] [name] [phone]). She confirmed that the family did not want to consider amputation of his infected toes. I discussed with her options for medical management of his infection and asked her to consider oral antibiotic therapy for control and suppression. I asked her to think about goals of care. I will call her again tomorrow.   HPI: [name] [name] is a 83 y.o. male who has been in failing health for at least the past several months who was admitted after being found down on the floor by his family. He has had problems with memory loss and when found was profoundly weak and confused. Blood cultures were obtained and one of 2 is already growing MSSA.   Review of Systems: Review of Systems  Unable to perform ROS: Mental acuity     Past Medical History:  Diagnosis Date  . Arthritis   . Asthma   . Diverticulosis of colon   . Dizziness   . HTN (hypertension)   . PAD (peripheral artery disease) (HCC)    severe bilateral lower ext disease. Runoff 1/12 per Cr. Chen with severe disease R SFA and below the knee    Social History  Substance Use Topics  . Smoking status: Former Games developermoker  . Smokeless tobacco: Never Used     Comment: quit 30 years ago  . Alcohol use No    Family History  Problem Relation Age of Onset  . Liver disease Brother    No Known Allergies  OBJECTIVE: Blood pressure (!) 144/68, pulse 82, temperature 97.5 F (36.4 C), temperature source Oral, resp. rate 16, height 6' (1.829 m), weight 185 lb (83.9 kg), SpO2 100 %.  Physical Exam  Constitutional:  He is alert and confused. He is feeding himself breakfast.  HENT:  Edentulous.  Eyes: Conjunctivae are normal.  Neck: Neck supple.  Cardiovascular: Normal rate and regular rhythm.   No murmur heard. Pulmonary/Chest: Effort normal and breath sounds normal. He has no wheezes. He has no rales.  Abdominal: Soft. There is no tenderness.  Musculoskeletal: He exhibits edema. He exhibits no tenderness.  He has diffuse evidence of degenerative joint disease. He  has some ulceration on his left great toe with swelling and deformity.  Neurological: He is alert.  Skin: No rash noted.    Lab Results Lab Results  Component Value Date   WBC 9.9 01/22/2017   HGB 14.3 01/22/2017   HCT 45.2 01/22/2017   MCV 94.4 01/22/2017   PLT 227 01/22/2017    Lab Results  Component Value Date   CREATININE 1.35 (H) 01/22/2017   BUN 40 (H) 01/22/2017   NA 152 (H) 01/22/2017   K 3.3 (L) 01/22/2017   CL 117 (H) 01/22/2017   CO2 28 01/22/2017    Lab Results  Component Value Date   ALT 35 01/20/2017   AST 92 (H) 01/20/2017   ALKPHOS 79 01/20/2017   BILITOT 1.0 01/20/2017     Microbiology: Recent Results (from the past 240 hour(s))  Urine culture      Status: Abnormal   Collection Time: 01/20/17 11:19 AM  Result Value Ref Range Status   Specimen Description URINE, CLEAN CATCH  Final   Special Requests NONE  Final   Culture >=100,000 COLONIES/mL STAPHYLOCOCCUS AUREUS (A)  Final   Report Status 01/22/2017 FINAL  Final   Organism ID, Bacteria STAPHYLOCOCCUS AUREUS (A)  Final      Susceptibility   Staphylococcus aureus - MIC*    CIPROFLOXACIN <=0.5 SENSITIVE Sensitive     GENTAMICIN <=0.5 SENSITIVE Sensitive     NITROFURANTOIN <=16 SENSITIVE Sensitive     OXACILLIN 0.5 SENSITIVE Sensitive     TETRACYCLINE <=1 SENSITIVE Sensitive     VANCOMYCIN <=0.5 SENSITIVE Sensitive     TRIMETH/SULFA <=10 SENSITIVE Sensitive     CLINDAMYCIN <=0.25 SENSITIVE Sensitive     RIFAMPIN <=0.5 SENSITIVE Sensitive     Inducible Clindamycin NEGATIVE Sensitive     * >=100,000 COLONIES/mL STAPHYLOCOCCUS AUREUS  Blood Culture (routine x 2)     Status: None (Preliminary result)   Collection Time: 01/20/17  1:15 PM  Result Value Ref Range Status   Specimen Description BLOOD LEFT HAND  Final   Special Requests IN PEDIATRIC BOTTLE Blood Culture adequate volume  Final   Culture   Final    NO GROWTH < 24 HOURS Performed at [facility], [address] [place], [place] [address]    Report Status PENDING  Incomplete  Blood Culture (routine x 2)     Status: None (Preliminary result)   Collection Time: 01/20/17  1:15 PM  Result Value Ref Range Status   Specimen Description BLOOD RIGHT HAND  Final   Special Requests IN PEDIATRIC BOTTLE Blood Culture adequate volume  Final   Culture  Setup Time   Final    GRAM POSITIVE COCCI IN CLUSTERS IN PEDIATRIC BOTTLE Organism ID to follow CRITICAL RESULT CALLED TO, READ BACK BY AND VERIFIED WITH: Peggyann Juba PHARMD [id number] 01/21/17 A BROWNING    Culture   Final    GRAM POSITIVE COCCI TOO YOUNG TO READ Performed at [facility], [address], [place], [place] [address]    Report Status PENDING  Incomplete    Blood Culture ID Panel (Reflexed)     Status: Abnormal   Collection Time: 01/20/17  1:15 PM  Result Value Ref Range Status   Enterococcus species NOT DETECTED NOT DETECTED Final   Listeria monocytogenes NOT DETECTED NOT DETECTED Final   Staphylococcus species DETECTED (A) NOT DETECTED Final    Comment: CRITICAL RESULT CALLED TO, READ BACK BY AND VERIFIED WITHPeggyann Juba PHARMD [id number] 01/21/17 A  BROWNING    Staphylococcus aureus DETECTED (A) NOT DETECTED Final    Comment: Methicillin (oxacillin) susceptible Staphylococcus aureus (MSSA). Preferred therapy is anti staphylococcal beta lactam antibiotic (Cefazolin or Nafcillin), unless clinically contraindicated. CRITICAL RESULT CALLED TO, READ BACK BY AND VERIFIED WITH: Peggyann Juba PHARMD [id number] 01/21/17 A BROWNING    Methicillin resistance NOT DETECTED NOT DETECTED Final   Streptococcus species NOT DETECTED NOT DETECTED Final   Streptococcus agalactiae NOT DETECTED NOT DETECTED Final   Streptococcus pneumoniae NOT DETECTED NOT DETECTED Final   Streptococcus pyogenes NOT DETECTED NOT DETECTED Final   Acinetobacter baumannii NOT DETECTED NOT DETECTED Final   Enterobacteriaceae species NOT DETECTED NOT DETECTED Final   Enterobacter cloacae complex NOT DETECTED NOT DETECTED Final   Escherichia coli NOT DETECTED NOT DETECTED Final   Klebsiella oxytoca NOT DETECTED NOT DETECTED Final   Klebsiella pneumoniae NOT DETECTED NOT DETECTED Final   Proteus species NOT DETECTED NOT DETECTED Final   Serratia marcescens NOT DETECTED NOT DETECTED Final   Haemophilus influenzae NOT DETECTED NOT DETECTED Final   Neisseria meningitidis NOT DETECTED NOT DETECTED Final   Pseudomonas aeruginosa NOT DETECTED NOT DETECTED Final   Candida albicans NOT DETECTED NOT DETECTED Final   Candida glabrata NOT DETECTED NOT DETECTED Final   Candida krusei NOT DETECTED NOT DETECTED Final   Candida parapsilosis NOT DETECTED NOT DETECTED Final   Candida tropicalis NOT DETECTED NOT  DETECTED Final    Comment: Performed at [facility], [address] [place], [place] [address]   Left foot x-ray 01/20/2017  IMPRESSION: Findings worrisome for osteomyelitis of the great toe and second toe distally. Probable chronic arthropathic change of the second MTP joint.    By: David  [place] M.D.   On: 01/20/2017 15:33  Cliffton Asters, MD [facility] [phone] pager   [phone] cell 01/22/2017, 11:14 AM

## 2017-01-22 NOTE — Evaluation (Addendum)
Occupational Therapy Evaluation Patient Details Name: [name] [name] MRN: [id number] DOB: [birth year] Today's Date: 01/22/2017    History of Present Illness Pt was admitted with AMS.   found to have osteomyelitis in 2 toes and sepsis.  H/o HTN, memory loss, and PAD   Clinical Impression   This 83 year old man was admitted for the above. Unsure if pt had any support at home, but per chart, he lived alone.  He will benefit from continued OT to increase safety and independence with adls.  Pt currently needs min A +2 safety. Goals are for supervision    Follow Up Recommendations  SNF;Supervision/Assistance - 24 hour    Equipment Recommendations  3 in 1 bedside commode    Recommendations for Other Services       Precautions / Restrictions Precautions Precautions: Fall Restrictions Weight Bearing Restrictions: No      Mobility Bed Mobility Overal bed mobility: Needs Assistance Bed Mobility: Supine to Sit     Supine to sit: Supervision;HOB elevated     General bed mobility comments: extra time and cues for seqence  Transfers Overall transfer level: Needs assistance Equipment used: Rolling walker (2 wheeled) Transfers: Sit to/from Stand Sit to Stand: Min assist;+2 safety/equipment         General transfer comment: assist to rise and steady    Balance Overall balance assessment: Needs assistance           Standing balance-Leahy Scale: Poor                             ADL either performed or assessed with clinical judgement   ADL Overall ADL's : Needs assistance/impaired     Grooming: Set up;Sitting   Upper Body Bathing: Set up;Sitting   Lower Body Bathing: Moderate assistance;+2 for safety/equipment;Sit to/from stand   Upper Body Dressing : Sitting;Minimal assistance (lines)   Lower Body Dressing: Maximal assistance;Sit to/from stand   Toilet Transfer: Minimal assistance;Ambulation;+2 for safety/equipment (chair)              General ADL Comments: pt with catheter in place. L foot wrapped with gauze.  Used surgery cover over this.  Pt very pleasant and cooperative during this session     Vision         Perception     Praxis      Pertinent Vitals/Pain Pain Assessment: No/denies pain     Hand Dominance     Extremity/Trunk Assessment Upper Extremity Assessment Upper Extremity Assessment: Generalized weakness (bil shoulders hard end feel approx 90)           Communication Communication Communication: No difficulties   Cognition Arousal/Alertness: Awake/alert Behavior During Therapy: WFL for tasks assessed/performed Overall Cognitive Status: No family/caregiver present to determine baseline cognitive functioning (h/o memory loss per chart)                                 General Comments: oriented to self and place.  Decreased STM/carry over of what was said & repeatedly asked same questions.  Cues for sequencing and safety   General Comments       Exercises     Shoulder Instructions      Home Living Family/patient expects to be discharged to:: Unsure Living Arrangements: Alone  Prior Functioning/Environment          Comments: pt unable to state; lived alone at baseline.          OT Problem List: Decreased strength;Decreased activity tolerance;Impaired balance (sitting and/or standing);Decreased cognition;Decreased safety awareness;Decreased knowledge of use of DME or AE      OT Treatment/Interventions: Self-care/ADL training;DME and/or AE instruction;Patient/family education;Balance training;Therapeutic activities;Cognitive remediation/compensation    OT Goals(Current goals can be found in the care plan section) Acute Rehab OT Goals Patient Stated Goal: home OT Goal Formulation: Patient unable to participate in goal setting Time For Goal Achievement: 01/29/17 Potential to Achieve Goals: Fair ADL Goals Pt  Will Perform Lower Body Bathing: with supervision;sit to/from stand Pt Will Perform Lower Body Dressing: with supervision;sit to/from stand Pt Will Transfer to Toilet: with supervision;ambulating;bedside commode Pt Will Perform Toileting - Clothing Manipulation and hygiene: with supervision;sit to/from stand Additional ADL Goal #1: pt wil not need more than 2 cues per activity for safety  OT Frequency: Min 2X/week   Barriers to D/C:            Co-evaluation PT/OT/SLP Co-Evaluation/Treatment: Yes Reason for Co-Treatment: For patient/therapist safety PT goals addressed during session: Mobility/safety with mobility OT goals addressed during session: ADL's and self-care      AM-PAC PT "6 Clicks" Daily Activity     Outcome Measure Help from another person eating meals?: None Help from another person taking care of personal grooming?: A Little Help from another person toileting, which includes using toliet, bedpan, or urinal?: A Little Help from another person bathing (including washing, rinsing, drying)?: A Lot Help from another person to put on and taking off regular upper body clothing?: A Little Help from another person to put on and taking off regular lower body clothing?: A Lot 6 Click Score: 17   End of Session Nurse Communication: Mobility status  Activity Tolerance: Patient tolerated treatment well Patient left: in chair;with call bell/phone within reach;with chair alarm set  OT Visit Diagnosis: Unsteadiness on feet (R26.81);Muscle weakness (generalized) (M62.81)                Time: 0981-1914 OT Time Calculation (min): 25 min Charges:  OT General Charges $OT Visit: 1 Procedure OT Evaluation $OT Eval Low Complexity: 1 Procedure G-Codes:     [place], OTR/L [phone] 01/22/2017  Roseanna Koplin 01/22/2017, 1:00 PM

## 2017-01-22 NOTE — Progress Notes (Signed)
  Speech Language Pathology Treatment: Dysphagia  Patient Details Name: [name] DownerSherman E Westerman MRN: [id number] DOB: Jul 22, 1924 Today's Date: 01/22/2017 Time: [id number] SLP Time Calculation (min) (ACUTE ONLY): 15 min  Assessment / Plan / Recommendation Clinical Impression  Pt seen at bedside for assessment of readiness to advance diet. Er RN, pt tolerating puree diet and nectar thick liquids well. Family has brought in pt dentures, which appear to be well-fitting. SLP observed pt with graham crackers and applesauce. Pt softened crackers in puree first, then appeared to tolerate this advanced consistency. Will advance diet to dys 2 (fine chop) with extra gravy/sauces. Nectar thick liquids will be continued for the time being. ST will follow for assessment of diet tolerance and readiness to advance from thickened liquids, as appropriate. Safe swallow precautions [facility] informed.    HPI HPI: 83 [facility].  PMH + for memory deficits, dizziness, PAD, arthritis, asthma.  Pt with poor intake per 1 week per review of MD notes.  Swallow evaluation ordered.  No CXR done with this admit      SLP Plan  Continue with current plan of care       Recommendations  Diet recommendations: Dysphagia 2 (fine chop);Nectar-thick liquid Liquids provided via: Cup;No straw Medication Administration: Whole meds with puree Supervision: Patient able to self feed;Staff to assist with self feeding;Intermittent supervision to cue for compensatory strategies Compensations: Minimize environmental distractions;Slow rate;Small sips/bites Postural Changes and/or Swallow Maneuvers: Seated upright 90 degrees                Oral Care Recommendations: Oral care BID Follow up Recommendations:  (TBD) SLP Visit Diagnosis: Dysphagia, oropharyngeal phase (R13.12) Plan: Continue with current plan of care       Annella Prowell B. IlaBueche, [facility]ar[facility] InstituteMSP, CCC-SLP [phone]  Leigh AuroraBueche, Rashan Patient  Brown 01/22/2017, 11:38 AM

## 2017-01-22 NOTE — Evaluation (Signed)
Physical Therapy Evaluation Patient Details Name: [name] [name] MRN: [id number] DOB: May 24, 1924 Today's Date: 01/22/2017   History of Present Illness  Pt was admitted with AMS.   found to have osteomyelitis in 2 toes and sepsis.  H/o HTN, memory loss, and PAD  Clinical Impression  Pt admitted as above and presenting with functional mobility limitations 2* generalized weakness, balance deficits and cognitive deficits.  Pt lives alone and would greatly benefit from follow up rehab at SNF level to maximize IND and safety.    Follow Up Recommendations SNF    Equipment Recommendations  None recommended by PT    Recommendations for Other Services       Precautions / Restrictions Precautions Precautions: Fall Restrictions Weight Bearing Restrictions: No      Mobility  Bed Mobility Overal bed mobility: Needs Assistance Bed Mobility: Supine to Sit     Supine to sit: Supervision;HOB elevated     General bed mobility comments: extra time and cues for seqence  Transfers Overall transfer level: Needs assistance Equipment used: Rolling walker (2 wheeled) Transfers: Sit to/from Stand Sit to Stand: Min assist;+2 safety/equipment         General transfer comment: assist to rise and steady  Ambulation/Gait Ambulation/Gait assistance: Min assist;+2 safety/equipment Ambulation Distance (Feet): 28 Feet Assistive device: Rolling walker (2 wheeled) Gait Pattern/deviations: Step-through pattern;Decreased step length - right;Decreased step length - left;Shuffle;Trunk flexed;Narrow base of support Gait velocity: decr Gait velocity interpretation: Below normal speed for age/gender General Gait Details: cues for posture and position from RW.  Physical assist for balance/support and chair follow 2* pt anxiety and intermittent buckling at knees  Stairs            Wheelchair Mobility    Modified Rankin (Stroke Patients Only)       Balance Overall balance assessment: Needs  assistance Sitting-balance support: Feet supported Sitting balance-Leahy Scale: Fair       Standing balance-Leahy Scale: Poor                               Pertinent Vitals/Pain Pain Assessment: No/denies pain    Home Living Family/patient expects to be discharged to:: Unsure Living Arrangements: Alone                    Prior Function           Comments: pt unable to state; lived alone at baseline.       Hand Dominance        Extremity/Trunk Assessment   Upper Extremity Assessment Upper Extremity Assessment: Generalized weakness    Lower Extremity Assessment Lower Extremity Assessment: Generalized weakness    Cervical / Trunk Assessment Cervical / Trunk Assessment: Normal  Communication   Communication: No difficulties  Cognition Arousal/Alertness: Awake/alert Behavior During Therapy: WFL for tasks assessed/performed Overall Cognitive Status: No family/caregiver present to determine baseline cognitive functioning                                 General Comments: oriented to self and place.  Decreased STM/carry over of what was said & repeatedly asked same questions.  Cues for sequencing and safety      General Comments      Exercises     Assessment/Plan    PT Assessment Patient needs continued PT services  PT Problem List Decreased strength;Decreased activity tolerance;Decreased balance;Decreased  mobility;Decreased cognition;Decreased knowledge of use of DME       PT Treatment Interventions DME instruction;Gait training;Functional mobility training;Therapeutic activities;Therapeutic exercise;Patient/family education    PT Goals (Current goals can be found in the Care Plan section)  Acute Rehab PT Goals Patient Stated Goal: home PT Goal Formulation: With patient Time For Goal Achievement: 09-14-2016 Potential to Achieve Goals: Fair    Frequency Min 3X/week   Barriers to discharge        Co-evaluation  PT/OT/SLP Co-Evaluation/Treatment: Yes Reason for Co-Treatment: For patient/therapist safety PT goals addressed during session: Mobility/safety with mobility OT goals addressed during session: ADL's and self-care       AM-PAC PT "6 Clicks" Daily Activity  Outcome Measure Difficulty turning over in bed (including adjusting bedclothes, sheets and blankets)?: A Little Difficulty moving from lying on back to sitting on the side of the bed? : A Little Difficulty sitting down on and standing up from a chair with arms (e.g., wheelchair, bedside commode, etc,.)?: A Lot Help needed moving to and from a bed to chair (including a wheelchair)?: A Lot Help needed walking in hospital room?: A Lot Help needed climbing 3-5 steps with a railing? : A Lot 6 Click Score: 14    End of Session Equipment Utilized During Treatment: Gait belt Activity Tolerance: Patient limited by fatigue Patient left: in chair;with call bell/phone within reach;with chair alarm set Nurse Communication: Mobility status PT Visit Diagnosis: Unsteadiness on feet (R26.81);Muscle weakness (generalized) (M62.81);History of falling (Z91.81)    Time: 1117-1140 PT Time Calculation (min) (ACUTE ONLY): 23 min   Charges:   PT Evaluation $PT Eval Low Complexity: 1 Procedure     PT G Codes:        Pg [phone]   Chaya Dehaan 01/22/2017, 2:36 PM

## 2017-01-23 ENCOUNTER — Inpatient Hospital Stay (HOSPITAL_COMMUNITY): Payer: Medicare Other

## 2017-01-23 DIAGNOSIS — J449 Chronic obstructive pulmonary disease, unspecified: Secondary | ICD-10-CM

## 2017-01-23 DIAGNOSIS — N179 Acute kidney failure, unspecified: Secondary | ICD-10-CM

## 2017-01-23 DIAGNOSIS — Z7189 Other specified counseling: Secondary | ICD-10-CM

## 2017-01-23 DIAGNOSIS — R7881 Bacteremia: Secondary | ICD-10-CM

## 2017-01-23 DIAGNOSIS — A4101 Sepsis due to Methicillin susceptible Staphylococcus aureus: Principal | ICD-10-CM

## 2017-01-23 DIAGNOSIS — L97529 Non-pressure chronic ulcer of other part of left foot with unspecified severity: Secondary | ICD-10-CM

## 2017-01-23 DIAGNOSIS — Z515 Encounter for palliative care: Secondary | ICD-10-CM

## 2017-01-23 LAB — BASIC METABOLIC PANEL
ANION GAP: 4 — AB (ref 5–15)
BUN: 32 mg/dL — ABNORMAL HIGH (ref 6–20)
BUN: 32 — AB (ref 4–21)
CHLORIDE: 113 mmol/L — AB (ref 101–111)
CO2: 30 mmol/L (ref 22–32)
CREATININE: 1.16 mg/dL (ref 0.61–1.24)
Calcium: 8.2 mg/dL — ABNORMAL LOW (ref 8.9–10.3)
Creatinine: 1.2 (ref 0.6–1.3)
GFR calc non Af Amer: 54 mL/min — ABNORMAL LOW (ref >60)
GLUCOSE: 125
GLUCOSE: 125 mg/dL — AB (ref 65–99)
Potassium: 4.1 (ref 3.4–5.3)
Potassium: 4.1 mmol/L (ref 3.5–5.1)
Sodium: 147 (ref 137–147)
Sodium: 147 mmol/L — ABNORMAL HIGH (ref 135–145)

## 2017-01-23 LAB — CBC WITH DIFFERENTIAL/PLATELET
BASOS PCT: 0 %
Basophils Absolute: 0 10*3/uL (ref 0.0–0.1)
Eosinophils Absolute: 0.1 10*3/uL (ref 0.0–0.7)
Eosinophils Relative: 2 %
HEMATOCRIT: 42.3 % (ref 39.0–52.0)
HEMOGLOBIN: 13.3 g/dL (ref 13.0–17.0)
Lymphocytes Relative: 15 %
Lymphs Abs: 1.2 10*3/uL (ref 0.7–4.0)
MCH: 29.6 pg (ref 26.0–34.0)
MCHC: 31.4 g/dL (ref 30.0–36.0)
MCV: 94.2 fL (ref 78.0–100.0)
MONOS PCT: 2 %
Monocytes Absolute: 0.2 10*3/uL (ref 0.1–1.0)
Neutro Abs: 6.4 10*3/uL (ref 1.7–7.7)
Neutrophils Relative %: 81 %
Platelets: 199 10*3/uL (ref 150–400)
RBC: 4.49 MIL/uL (ref 4.22–5.81)
RDW: 14.6 % (ref 11.5–15.5)
WBC: 8 10*3/uL (ref 4.0–10.5)

## 2017-01-23 LAB — CK: Total CK: 226 U/L (ref 49–397)

## 2017-01-23 LAB — CBC AND DIFFERENTIAL
HEMATOCRIT: 42 (ref 41–53)
HEMOGLOBIN: 13.3 — AB (ref 13.5–17.5)
Platelets: 199 (ref 150–399)
WBC: 8

## 2017-01-23 MED ORDER — DEXTROSE-NACL 5-0.45 % IV SOLN
INTRAVENOUS | Status: DC
Start: 2017-01-23 — End: 2017-01-25
  Administered 2017-01-23 – 2017-01-25 (×2): via INTRAVENOUS

## 2017-01-23 MED ORDER — LORAZEPAM 1 MG PO TABS
1.0000 mg | ORAL_TABLET | ORAL | Status: DC | PRN
  Administered 2017-01-23 (×2): 1 mg via ORAL
  Filled 2017-01-23 (×2): qty 1

## 2017-01-23 MED ORDER — ENOXAPARIN SODIUM 40 MG/0.4ML ~~LOC~~ SOLN
40.0000 mg | SUBCUTANEOUS | Status: DC
  Administered 2017-01-24: 40 mg via SUBCUTANEOUS
  Filled 2017-01-23: qty 0.4

## 2017-01-23 MED ORDER — HALOPERIDOL 1 MG PO TABS
1.0000 mg | ORAL_TABLET | ORAL | Status: DC | PRN
  Administered 2017-01-23: 1 mg via ORAL
  Filled 2017-01-23: qty 1

## 2017-01-23 MED ORDER — HALOPERIDOL LACTATE 5 MG/ML IJ SOLN
1.0000 mg | INTRAMUSCULAR | Status: DC | PRN
  Administered 2017-01-23: 1 mg via INTRAMUSCULAR
  Filled 2017-01-23 (×2): qty 1

## 2017-01-23 MED ORDER — SENNA 8.6 MG PO TABS: 1.0000 | ORAL_TABLET | Freq: Every day | ORAL | Status: DC

## 2017-01-23 NOTE — Consult Note (Signed)
Consultation Note Date: 01/23/2017   Patient Name: [name] [name]  DOB: 1923/09/18  MRN: [id number]  Age / Sex: 83 y.o., male  PCP: Tisovec, Fransico Him, MD Referring Physician: Tawni Millers  Reason for Consultation: Establishing goals of care and Psychosocial/spiritual support  HPI/Patient Profile: 83 y.o. male  with past medical history of severe PAD, asthma, HTN, memory loss, and osteoarthritis. He lives alone at home with family visiting frequently. He has had declinging health since the death of his wife in June 17, 2016. He presented to the ED with progressive weakness and confusion. He was admitted on 6/4/2018with work-up revealing sepsis with MSSA bacteremia from likely chronic osteomyelitis of the left first and second toes and possible second metatarsal and MTP joint. He also had hypernatremia and rhabdomyolysis. Family declined amputation of the infected toeas. ID following. Palliative consulted to assist in clarifying goals of care.  Clinical Assessment and Goals of Care: I met [name] [name] at his bedside. No family was present. We talked about how things were going prior to admission. He relayed that he has been emotionally struggling since the death of his wife this past June 18, 2023, but feels that his health has been relatively stable. He reports that he has been predominantly independent at home, with some support provided by neighbors and his daughter. I spoke with his daughter over the phone who paints a different story, specifically that he has been weaker, more dependent on support, and having increased periods of confusion. He did not have insight into these things. Furthermore, he did not recall the events leading up to this hospitalization, which included crawling around his house due to significant weakness.   In talking through this admission, he understood he had a serious infection in his blood, which his doctors believe came  from his left toes. He struggled to understand what osteomyelitis is, despite a simplified explanation, but did grasp that it was very serious. We talked through the different care routes, which included amputation, medical management for a determined course then transition to comfort measures, oral antibiotics for control and suppression, or transitioning directly into comfort measures with Hospice utilization. The benefits/burdens/risks of each option were discussed. I also talked about these options with his daughter.  [name] [name] goal is to be independent at home. He is adamant about not wanting amputation, but neither does he want to let the infection take over and accept end of life care. Consequently, he wants to treat the infection aggressively with antibiotics, and then continue oral antibiotics for suppression. We did talk about the possibility of recurrent blood infections and deterioration. He accepts this possibility but would at least like to try ongoing medical management in an effort to get home and have time there.   Finally, we did talk about the post hospital discharge plan. He is very hesitant about SNF/rehab. I emphasized the value of rehab as a way to improve safety, strength, and maximize independence. I also clarified that it could be for a short duration based on his performance status. He was willing to consider it but wanted to think on it further.   Primary Decision Maker PATIENT  SUMMARY OF RECOMMENDATIONS    Pt wants to treat the infection aggressively with antibiotics. IV abx are fine at SNF, however I would recommend against IV abx and favor PO abx if he goes directly home (even with home health, I do not believe he would be capable of managing IV abx at home).   Pt open to the  possibility of SNF/rehab, but would like to think on it further. If he goes to SNF, would have palliative follow. If he refuses SNF, would have [facility] follow with referral to  [facility]  Given the variability in his mentation, noted in chart review, I would strongly recommend a psych consult for formal capacity evaluation  Code Status/Advance Care Planning:  DNR  Palliative Prophylaxis:   Bowel Regimen and Frequent Pain Assessment  Additional Recommendations (Limitations, Scope, Preferences):  Full Scope Treatment  Psycho-social/Spiritual:   Desire for further Chaplaincy support:no  Additional Recommendations: Education on Hospice  Prognosis:   Unable to determine  Discharge Planning: To Be Determined      Primary Diagnoses: Present on Admission: . Sepsis ([place]) . COPD (chronic obstructive pulmonary disease) ([place]) . Essential hypertension . PVD . Foot ulcer ([place]) . Hypernatremia . Rhabdomyolysis . AKI (acute kidney injury) ([place]) . Protein-calorie malnutrition, severe . Osteomyelitis of toe of left foot ([place]) . Staphylococcus aureus bacteremia   I have reviewed the medical record, interviewed the patient and family, and examined the patient. The following aspects are pertinent.  Past Medical History:  Diagnosis Date  . Arthritis   . Asthma   . Diverticulosis of colon   . Dizziness   . HTN (hypertension)   . PAD (peripheral artery disease) (HCC)    severe bilateral lower ext disease. Runoff 1/12 per Cr. Chen with severe disease R SFA and below the knee   Social History   Social History  . Marital status: Married    Spouse name: N/A  . Number of children: N/A  . Years of education: N/A   Occupational History  . 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 Smoking status: Former Research scientist (life sciences)  . Smokeless tobacco: Never Used     Comment: quit 30 years ago  . Alcohol use No  . Drug use: No  . Sexual activity: Not Asked   Other Topics Concern  . None   Social History Narrative  . None   Family History  Problem Relation Age of Onset   . Liver disease Brother    Scheduled Meds: . enoxaparin (LOVENOX) injection  40 mg Subcutaneous Q24H  . feeding supplement (ENSURE ENLIVE)  237 mL Oral BID BM  . nystatin  5 mL Oral QID   Continuous Infusions: . ampicillin-sulbactam (UNASYN) IV 3 g (01/23/17 0200)  . dextrose 5 % 1,000 mL with potassium chloride 20 mEq infusion 100 mL/hr at 01/23/17 0403   PRN Meds:.acetaminophen **OR** acetaminophen, albuterol, bisacodyl, hydrALAZINE, RESOURCE THICKENUP CLEAR No Known Allergies   Review of Systems  Constitutional: Positive for activity change and fatigue. Negative for appetite change.  HENT: Positive for trouble swallowing. Negative for congestion, facial swelling, hearing loss and sore throat.   Eyes: Negative for visual disturbance.  Respiratory: Negative for chest tightness and shortness of breath.   Cardiovascular: Negative for chest pain and leg swelling.  Gastrointestinal: Negative for abdominal distention, abdominal pain, nausea and vomiting.  Genitourinary: Negative for flank pain.  Musculoskeletal: Positive for gait problem. Negative for back pain.  Skin: Positive for pallor.  Neurological: Positive for weakness. Negative for dizziness and speech difficulty.  Hematological: Bruises/bleeds easily.  Psychiatric/Behavioral: Negative for confusion (Pt denies) and sleep disturbance. The patient is nervous/anxious.    Physical Exam  Constitutional: He is oriented to person, place, and time. Vital signs are normal. He has a sickly appearance.  Frail man, appears stated age  HENT:  Head: Normocephalic and atraumatic.  Mouth/Throat: Oropharynx is clear and moist and mucous membranes are normal.  Eyes: EOM are normal.  Neck: Normal range of motion. Neck supple.  Cardiovascular: Normal rate and regular rhythm.   Pulmonary/Chest: Effort normal and breath sounds normal.  Abdominal: Soft. Bowel sounds are normal.  Musculoskeletal: He exhibits no edema.  Generalized weakness,  poor ROM  Neurological: He is alert and oriented to person, place, and time.  Skin: Skin is warm and dry.  Psychiatric: He has a normal mood and affect. Judgment and thought content normal. His speech is delayed. He is slowed. He exhibits abnormal recent memory.    Vital Signs: BP (!) 144/57 (BP Location: Right Arm)   Pulse 76   Temp 97.6 F (36.4 C) (Oral)   Resp 20   Ht 6' (1.829 m)   Wt 83.9 kg (185 lb)   SpO2 98%   BMI 25.09 kg/m  Pain Assessment: 0-10   Pain Score: 0-No pain  SpO2: SpO2: 98 % O2 Device:SpO2: 98 % O2 Flow Rate: .O2 Flow Rate (L/min): 2 L/min  IO: Intake/output summary:  Intake/Output Summary (Last 24 hours) at 01/23/17 0752 Last data filed at 01/23/17 0600  Gross per 24 hour  Intake             2625 ml  Output                0 ml  Net             2625 ml    LBM:   Baseline Weight: Weight: 83.9 kg (185 lb) Most recent weight: Weight: 83.9 kg (185 lb)     Palliative Assessment/Data: PPS 50%    Time Total: 50 minutes Greater than 50%  of this time was spent counseling and coordinating care related to the above assessment and plan.  Signed by: Charlynn Court, NP Palliative Medicine Team Pager # [phone] (M-F 7a-5p) Team Phone # [phone] (Nights/Weekends)

## 2017-01-23 NOTE — Progress Notes (Signed)
Patient agitated, confused, adamant on leaving.  Order for Haldol received with no response.  Order for Ativan received with patient able to calm down.

## 2017-01-23 NOTE — [place] FL2 (Signed)
[place] MEDICAID FL2 LEVEL OF CARE SCREENING TOOL     IDENTIFICATION  Patient Name: [name] [name] Birthdate: [birth year] Sex: male Admission Date (Current Location): 01/20/2017  [facility] and [place] Number:  Producer, television/film/video and Address:  [facility],  [address] [place], [place] [address]      Provider Number: [id number]  Attending Physician Name and Address:  Coralie Keens  Relative Name and Phone Number:       Current Level of Care: Hospital Recommended Level of Care: Skilled Nursing Facility Prior Approval Number:    Date Approved/Denied:   PASRR Number: [id number]  Discharge Plan:      Current Diagnoses: Patient Active Problem List   Diagnosis Date Noted  . Protein-calorie malnutrition, severe 01/22/2017  . Osteomyelitis of toe of left foot (HCC) 01/22/2017  . Staphylococcus aureus bacteremia 01/22/2017  . Memory loss 01/22/2017  . Dehydration, severe   . Moderate protein-calorie malnutrition (HCC)   . Sepsis (HCC) 01/20/2017  . Foot ulcer (HCC) 01/20/2017  . Hypernatremia 01/20/2017  . Rhabdomyolysis 01/20/2017  . AKI (acute kidney injury) (HCC) 01/20/2017  . TOBACCO ABUSE 09/21/2010  . CELLULITIS AND ABSCESS OF LEG EXCEPT FOOT 09/11/2010  . PVD 07/04/2010  . HYPERCHOLESTEROLEMIA 07/03/2010  . Essential hypertension 07/03/2010  . ASTHMA 07/03/2010  . COPD (chronic obstructive pulmonary disease) (HCC) 07/03/2010  . DIVERTICULOSIS, COLON, HX OF 07/03/2010  . ATHEROSLERO NATIVE ART EXTREMITIES W/ULCERATION 06/22/2010    Orientation RESPIRATION BLADDER Height & Weight     Self (Memory Impairment)  Normal Continent Weight: 185 lb (83.9 kg) Height:  6' (182.9 cm)  BEHAVIORAL SYMPTOMS/MOOD NEUROLOGICAL BOWEL NUTRITION STATUS      Continent Diet (Please see discharge summary )  AMBULATORY STATUS COMMUNICATION OF NEEDS Skin   Extensive Assist Verbally Other (Comment) (Non-pressure Wound foot left open wound to left posterior  foot, toe/Clean, Dry Intact.  Foot left anterior foot, Clean, Dry Intact    )                       Personal Care Assistance Level of Assistance  Bathing, Feeding, Dressing Bathing Assistance: Limited assistance Feeding assistance: Independent Dressing Assistance: Limited assistance     Functional Limitations Info  Sight, Hearing, Speech Sight Info: Adequate Hearing Info: Adequate Speech Info: Adequate    SPECIAL CARE FACTORS FREQUENCY  PT (By licensed PT), OT (By licensed OT)     PT Frequency: Min 3X/week OT Frequency: Min 2X/week            Contractures Contractures Info: Not present    Additional Factors Info  Code Status, Allergies Code Status Info: DNR Allergies Info: No Known Allergies           Current Medications (01/23/2017):  This is the current hospital active medication list Current Facility-Administered Medications  Medication Dose Route Frequency Provider Last Rate Last Dose  . acetaminophen (TYLENOL) tablet 650 mg  650 mg Oral Q6H PRN Short, Thea Silversmith, MD       Or  . acetaminophen (TYLENOL) suppository 650 mg  650 mg Rectal Q6H PRN Short, Mackenzie, MD      . albuterol (PROVENTIL) (2.5 MG/3ML) 0.083% nebulizer solution 2.5 mg  2.5 mg Nebulization Q2H PRN Short, Mackenzie, MD      . Ampicillin-Sulbactam (UNASYN) 3 g in sodium chloride 0.9 % 100 mL IVPB  3 g Intravenous Q6H Wofford, Drew A, RPH 200 mL/hr at 01/23/17 0200 3 g at 01/23/17 0200  . bisacodyl (  DULCOLAX) suppository 10 mg  10 mg Rectal Daily PRN Short, Mackenzie, MD      . dextrose 5 % 1,000 mL with potassium chloride 20 mEq infusion   Intravenous Continuous Rodolph Bonghompson, Daniel V, MD 100 mL/hr at 01/23/17 0403    . enoxaparin (LOVENOX) injection 40 mg  40 mg Subcutaneous Q24H Phylliss BlakesLilliston, Andrea M, RPH      . feeding supplement (ENSURE ENLIVE) (ENSURE ENLIVE) liquid 237 mL  237 mL Oral BID BM Narda BondsNettey, Ralph A, MD   237 mL at 01/22/17 1400  . hydrALAZINE (APRESOLINE) injection 5 mg  5 mg  Intravenous Q4H PRN Short, Mackenzie, MD      . nystatin (MYCOSTATIN) 100000 UNIT/ML suspension 500,000 Units  5 mL Oral QID Schorr, Roma KayserKatherine P, NP   500,000 Units at 01/22/17 2146  . RESOURCE THICKENUP CLEAR   Oral PRN Narda BondsNettey, Ralph A, MD         Discharge Medications: Please see discharge summary for a list of discharge medications.  Relevant Imaging Results:  Relevant Lab Results:   Additional Information SSN:[id number]  Clearance CootsNicole A Sinclair, LCSW

## 2017-01-23 NOTE — Progress Notes (Signed)
PROGRESS NOTE    [name] [name]  ZHY:[id number] DOB: [birth year] DOA: 01/20/2017 PCP: Gaspar Garbe, MD    Brief Narrative:  83 yo male presented with altered mental status. Patient known to have HTN and asthma. Has developed progressive confusion and weakness, found on the floor by his family. On the initial examination he was lethargic, blood pressure 161/88, RR 20, HR 95 with oxygen saturation 96%. Oral mucosa moist, lungs clear to auscultation, heart tachycardic, abdomen soft and non tender, lower extremities with left foot, 1st toe with purulent ulcerated lesion, plus other multiple wounds. Left foot x ray with acute osteomyelitis on the first and second toes. Patient was admitted for further treatment.      Assessment & Plan:   Principal Problem:   Staphylococcus aureus bacteremia Active Problems:   Essential hypertension   PVD   COPD (chronic obstructive pulmonary disease) (HCC)   Sepsis (HCC)   Foot ulcer (HCC)   Hypernatremia   Rhabdomyolysis   AKI (acute kidney injury) (HCC)   Protein-calorie malnutrition, severe   Osteomyelitis of toe of left foot (HCC)   Memory loss   Dehydration, severe   Moderate protein-calorie malnutrition (HCC)   1. Left foot osteomyelitis. Will continue antibiotic therapy with Unasyn, blood culture positive for staphylococcus. Follow cultures with no growth, follow on echocardiogram. Follow with ID recommendations, po vs IV antibiotic therapy. Patient with no fevers, pain is controlled, wbc at 8.0  2. Metabolic encephalopathy. Will continue neuro checks per unit protocol, patient more awake and alert, no confusion or agitation.    3. HTN. Will continue hydralazine as needed. Gentle hydration with IV fluids. d5 half saline.  4. Calorie protein malnutrition. Will continue feeding supplements.   5. Hypernatremia and hyperchloremia. Will continue hydration with half saline, follow renal panel in am.   6. AKI with Rhabdomyolysis. Will  continue hydration with IV fluids, CPK trending down, renal function improving with cr at 1,16 from 2,17 on admission. Follow renal panel in am, avoid hypotension or nephrotoxic medications.    DVT prophylaxis: enoxaparin  Code Status: full  Family Communication: No family at the bedside  Disposition Plan: SNF   Consultants:   I.D  Procedures:    Antimicrobials:   Unasyn     Subjective: Patient with generalized malaise, no nausea or vomiting, no foot pain, no fever or chills. No chest pain. Decrease ambulation.   Objective: Vitals:   01/22/17 0608 01/22/17 1347 01/22/17 2115 01/23/17 0501  BP: (!) 144/68 134/60 (!) 132/51 (!) 144/57  Pulse: 82 87 81 76  Resp: 16 16 18 20   Temp: 97.5 F (36.4 C) 98.3 F (36.8 C) 98.1 F (36.7 C) 97.6 F (36.4 C)  TempSrc: Oral Oral Oral Oral  SpO2: 100% 100% 98% 98%  Weight:      Height:        Intake/Output Summary (Last 24 hours) at 01/23/17 1159 Last data filed at 01/23/17 1144  Gross per 24 hour  Intake             2925 ml  Output              150 ml  Net             2775 ml   Filed Weights   01/20/17 1046  Weight: 83.9 kg (185 lb)    Examination:  General exam: deconditioned E ENT. Mild pallor no icterus, oral mucosa moist.  Respiratory system: Clear to auscultation. Respiratory effort normal. No wheezing,  rales or rhonchi.  Cardiovascular system: S1 & S2 heard, RRR. No JVD, murmurs, rubs, gallops or clicks. No pedal edema. Gastrointestinal system: Abdomen is nondistended, soft and nontender. No organomegaly or masses felt. Normal bowel sounds heard. Central nervous system: Alert and oriented. No focal neurological deficits. Extremities: Symmetric 5 x 5 power. Skin: No rashes, lesions or ulcers     Data Reviewed: I have personally reviewed following labs and imaging studies  CBC:  Recent Labs Lab 01/20/17 1119 01/21/17 0633 01/22/17 1002 01/23/17 0657  WBC 14.8* 11.5* 9.9 8.0  NEUTROABS  --   --  8.5*  6.4  HGB 17.8* 15.3 14.3 13.3  HCT 54.2* 47.9 45.2 42.3  MCV 92.0 94.3 94.4 94.2  PLT 291 201 227 199   Basic Metabolic Panel:  Recent Labs Lab 01/20/17 1119 01/21/17 0633 01/22/17 1002 01/23/17 0657  NA 150* 151* 152* 147*  K 4.8 4.1 3.3* 4.1  CL 107 117* 117* 113*  CO2 26 23 28 30   GLUCOSE 143* 94 107* 125*  BUN 67* 52* 40* 32*  CREATININE 2.17* 1.49* 1.35* 1.16  CALCIUM 9.2 8.6* 8.4* 8.2*   GFR: Estimated Creatinine Clearance: 46.5 mL/min (by C-G formula based on SCr of 1.16 mg/dL). Liver Function Tests:  Recent Labs Lab 01/20/17 1119  AST 92*  ALT 35  ALKPHOS 79  BILITOT 1.0  PROT 7.7  ALBUMIN 2.9*   No results for input(s): LIPASE, AMYLASE in the last 168 hours. No results for input(s): AMMONIA in the last 168 hours. Coagulation Profile: No results for input(s): INR, PROTIME in the last 168 hours. Cardiac Enzymes:  Recent Labs Lab 01/20/17 1119 01/21/17 0633 01/22/17 0719 01/23/17 0657  CKTOTAL 2,166* 1,053* 436* 226   BNP (last 3 results) No results for input(s): PROBNP in the last 8760 hours. HbA1C:  Recent Labs  01/20/17 1626  HGBA1C 5.5   CBG: No results for input(s): GLUCAP in the last 168 hours. Lipid Profile: No results for input(s): CHOL, HDL, LDLCALC, TRIG, CHOLHDL, LDLDIRECT in the last 72 hours. Thyroid Function Tests:  Recent Labs  01/21/17 0633  TSH 1.234   Anemia Panel:  Recent Labs  01/21/17 1610  VITAMINB12 2,055*   Sepsis Labs:  Recent Labs Lab 01/20/17 1321 01/20/17 1650  LATICACIDVEN 2.97* 1.7    Recent Results (from the past 240 hour(s))  Urine culture     Status: Abnormal   Collection Time: 01/20/17 11:19 AM  Result Value Ref Range Status   Specimen Description URINE, CLEAN CATCH  Final   Special Requests NONE  Final   Culture >=100,000 COLONIES/mL STAPHYLOCOCCUS AUREUS (A)  Final   Report Status 01/22/2017 FINAL  Final   Organism ID, Bacteria STAPHYLOCOCCUS AUREUS (A)  Final      Susceptibility     Staphylococcus aureus - MIC*    CIPROFLOXACIN <=0.5 SENSITIVE Sensitive     GENTAMICIN <=0.5 SENSITIVE Sensitive     NITROFURANTOIN <=16 SENSITIVE Sensitive     OXACILLIN 0.5 SENSITIVE Sensitive     TETRACYCLINE <=1 SENSITIVE Sensitive     VANCOMYCIN <=0.5 SENSITIVE Sensitive     TRIMETH/SULFA <=10 SENSITIVE Sensitive     CLINDAMYCIN <=0.25 SENSITIVE Sensitive     RIFAMPIN <=0.5 SENSITIVE Sensitive     Inducible Clindamycin NEGATIVE Sensitive     * >=100,000 COLONIES/mL STAPHYLOCOCCUS AUREUS  Blood Culture (routine x 2)     Status: None (Preliminary result)   Collection Time: 01/20/17  1:15 PM  Result Value Ref Range Status  Specimen Description BLOOD LEFT HAND  Final   Special Requests IN PEDIATRIC BOTTLE Blood Culture adequate volume  Final   Culture   Final    NO GROWTH 2 DAYS Performed at [facility], [address] [place], [place] [address]    Report Status PENDING  Incomplete  Blood Culture (routine x 2)     Status: None (Preliminary result)   Collection Time: 01/20/17  1:15 PM  Result Value Ref Range Status   Specimen Description BLOOD RIGHT HAND  Final   Special Requests IN PEDIATRIC BOTTLE Blood Culture adequate volume  Final   Culture  Setup Time   Final    GRAM POSITIVE COCCI IN CLUSTERS IN PEDIATRIC BOTTLE CRITICAL RESULT CALLED TO, READ BACK BY AND VERIFIED WITH: Peggyann JubaJ GRIMSLEY PHARMD [id number] 01/21/17 A BROWNING    Culture   Final    GRAM POSITIVE COCCI IDENTIFICATION AND SUSCEPTIBILITIES TO FOLLOW Performed at [facility], [address] [place], [place] [address]    Report Status PENDING  Incomplete  Blood Culture ID Panel (Reflexed)     Status: Abnormal   Collection Time: 01/20/17  1:15 PM  Result Value Ref Range Status   Enterococcus species NOT DETECTED NOT DETECTED Final   Listeria monocytogenes NOT DETECTED NOT DETECTED Final   Staphylococcus species DETECTED (A) NOT DETECTED Final    Comment: CRITICAL RESULT CALLED TO, READ BACK BY AND  VERIFIED WITH: Peggyann JubaJ GRIMSLEY PHARMD [id number] 01/21/17 A BROWNING    Staphylococcus aureus DETECTED (A) NOT DETECTED Final    Comment: Methicillin (oxacillin) susceptible Staphylococcus aureus (MSSA). Preferred therapy is anti staphylococcal beta lactam antibiotic (Cefazolin or Nafcillin), unless clinically contraindicated. CRITICAL RESULT CALLED TO, READ BACK BY AND VERIFIED WITH: Peggyann JubaJ GRIMSLEY PHARMD [phone] 01/21/17 A BROWNING    Methicillin resistance NOT DETECTED NOT DETECTED Final   Streptococcus species NOT DETECTED NOT DETECTED Final   Streptococcus agalactiae NOT DETECTED NOT DETECTED Final   Streptococcus pneumoniae NOT DETECTED NOT DETECTED Final   Streptococcus pyogenes NOT DETECTED NOT DETECTED Final   Acinetobacter baumannii NOT DETECTED NOT DETECTED Final   Enterobacteriaceae species NOT DETECTED NOT DETECTED Final   Enterobacter cloacae complex NOT DETECTED NOT DETECTED Final   Escherichia coli NOT DETECTED NOT DETECTED Final   Klebsiella oxytoca NOT DETECTED NOT DETECTED Final   Klebsiella pneumoniae NOT DETECTED NOT DETECTED Final   Proteus species NOT DETECTED NOT DETECTED Final   Serratia marcescens NOT DETECTED NOT DETECTED Final   Haemophilus influenzae NOT DETECTED NOT DETECTED Final   Neisseria meningitidis NOT DETECTED NOT DETECTED Final   Pseudomonas aeruginosa NOT DETECTED NOT DETECTED Final   Candida albicans NOT DETECTED NOT DETECTED Final   Candida glabrata NOT DETECTED NOT DETECTED Final   Candida krusei NOT DETECTED NOT DETECTED Final   Candida parapsilosis NOT DETECTED NOT DETECTED Final   Candida tropicalis NOT DETECTED NOT DETECTED Final    Comment: Performed at [facility], [address] [place], [place] [address]         Radiology Studies: Dg Hip Unilat With Pelvis 2-3 Views Left  Result Date: 01/21/2017 CLINICAL DATA:  Fall with left hip pain. EXAM: DG HIP (WITH OR WITHOUT PELVIS) 2-3V LEFT COMPARISON:  None. FINDINGS: No acute fracture or  dislocation identified. There is osteoarthritis of the hip joint. Atherosclerotic calcifications are seen involving the femoral arteries. No bony lesions or destruction. IMPRESSION: No evidence of acute hip fracture. Osteoarthritis present of the left hip joint. Femoral atherosclerosis.  Electronically Signed   By: Irish Lack M.D.   On: 01/21/2017 16:17        Scheduled Meds: . enoxaparin (LOVENOX) injection  40 mg Subcutaneous Q24H  . feeding supplement (ENSURE ENLIVE)  237 mL Oral BID BM  . nystatin  5 mL Oral QID   Continuous Infusions: . ampicillin-sulbactam (UNASYN) IV 3 g (01/23/17 1103)  . dextrose 5 % 1,000 mL with potassium chloride 20 mEq infusion 100 mL/hr at 01/23/17 0403     LOS: 3 days       Mauricio Annett Gula, MD [facility] Pager [phone]  If 7PM-7AM, please contact night-coverage [email or web address] Password [email or web address] 01/23/2017, 11:59 AM

## 2017-01-23 NOTE — CV Procedure (Signed)
2D echo attempted at 235p, but patient needed bedpan. Came back at 337p and patient was being cleaned up. Will try echo later.

## 2017-01-23 NOTE — Progress Notes (Signed)
Patient ID: [name] [name], male   DOB: 06-Nov-1923, 83 y.o.   MRN: [id number]          [facility]  Date of Admission:  01/20/2017    Total days of antibiotics 3        Day 2 ampicillin sulbactam         Principal Problem:   Staphylococcus aureus bacteremia Active Problems:   Sepsis (HCC)   Osteomyelitis of toe of left foot (HCC)   Essential hypertension   PVD   COPD (chronic obstructive pulmonary disease) (HCC)   Foot ulcer (HCC)   Hypernatremia   Rhabdomyolysis   AKI (acute kidney injury) (HCC)   Protein-calorie malnutrition, severe   Memory loss   Dehydration, severe   Moderate protein-calorie malnutrition (HCC)   Goals of care, counseling/discussion   Palliative care by specialist   . enoxaparin (LOVENOX) injection  40 mg Subcutaneous Q24H  . feeding supplement (ENSURE ENLIVE)  237 mL Oral BID BM  . nystatin  5 mL Oral QID  . senna  1 tablet Oral QHS    SUBJECTIVE: He denies any problems but he is confused.  Review of Systems: Review of Systems  Unable to perform ROS: Mental acuity    Past Medical History:  Diagnosis Date  . Arthritis   . Asthma   . Diverticulosis of colon   . Dizziness   . HTN (hypertension)   . PAD (peripheral artery disease) (HCC)    severe bilateral lower ext disease. Runoff 1/12 per Cr. Chen with severe disease R SFA and below the knee    Social History  Substance Use Topics  . Smoking status: Former Games developermoker  . Smokeless tobacco: Never Used     Comment: quit 30 years ago  . Alcohol use No    Family History  Problem Relation Age of Onset  . Liver disease Brother    No Known Allergies  OBJECTIVE: Vitals:   01/22/17 1347 01/22/17 2115 01/23/17 0501 01/23/17 1500  BP: 134/60 (!) 132/51 (!) 144/57 (!) 139/51  Pulse: 87 81 76 87  Resp: 16 18 20 18   Temp: 98.3 F (36.8 C) 98.1 F (36.7 C) 97.6 F (36.4 C) 98 F (36.7 C)  TempSrc: Oral Oral Oral Oral  SpO2: 100% 98% 98% 98%  Weight:        Height:       Body mass index is 25.09 kg/m.  Physical Exam  Constitutional:  He is alert and in no distress but confused. He is sitting up in bed eating dinner. When I entered the room he was talking into the call button thinking it was a telephone.  Cardiovascular: Normal rate and regular rhythm.   No murmur heard. Pulmonary/Chest: Effort normal and breath sounds normal.  Musculoskeletal:  No change in the swelling and deformity of his left great toe with ulceration.    Lab Results Lab Results  Component Value Date   WBC 8.0 01/23/2017   HGB 13.3 01/23/2017   HCT 42.3 01/23/2017   MCV 94.2 01/23/2017   PLT 199 01/23/2017    Lab Results  Component Value Date   CREATININE 1.16 01/23/2017   BUN 32 (H) 01/23/2017   NA 147 (H) 01/23/2017   K 4.1 01/23/2017   CL 113 (H) 01/23/2017   CO2 30 01/23/2017    Lab Results  Component Value Date   ALT 35 01/20/2017   AST 92 (H) 01/20/2017   ALKPHOS 79 01/20/2017   BILITOT  1.0 01/20/2017     Microbiology: Recent Results (from the past 240 hour(s))  Urine culture     Status: Abnormal   Collection Time: 01/20/17 11:19 AM  Result Value Ref Range Status   Specimen Description URINE, CLEAN CATCH  Final   Special Requests NONE  Final   Culture >=100,000 COLONIES/mL STAPHYLOCOCCUS AUREUS (A)  Final   Report Status 01/22/2017 FINAL  Final   Organism ID, Bacteria STAPHYLOCOCCUS AUREUS (A)  Final      Susceptibility   Staphylococcus aureus - MIC*    CIPROFLOXACIN <=0.5 SENSITIVE Sensitive     GENTAMICIN <=0.5 SENSITIVE Sensitive     NITROFURANTOIN <=16 SENSITIVE Sensitive     OXACILLIN 0.5 SENSITIVE Sensitive     TETRACYCLINE <=1 SENSITIVE Sensitive     VANCOMYCIN <=0.5 SENSITIVE Sensitive     TRIMETH/SULFA <=10 SENSITIVE Sensitive     CLINDAMYCIN <=0.25 SENSITIVE Sensitive     RIFAMPIN <=0.5 SENSITIVE Sensitive     Inducible Clindamycin NEGATIVE Sensitive     * >=100,000 COLONIES/mL STAPHYLOCOCCUS AUREUS  Blood Culture  (routine x 2)     Status: None (Preliminary result)   Collection Time: 01/20/17  1:15 PM  Result Value Ref Range Status   Specimen Description BLOOD LEFT HAND  Final   Special Requests IN PEDIATRIC BOTTLE Blood Culture adequate volume  Final   Culture   Final    NO GROWTH 3 DAYS Performed at [facility], [address] [place], [place] [address]    Report Status PENDING  Incomplete  Blood Culture (routine x 2)     Status: None (Preliminary result)   Collection Time: 01/20/17  1:15 PM  Result Value Ref Range Status   Specimen Description BLOOD RIGHT HAND  Final   Special Requests IN PEDIATRIC BOTTLE Blood Culture adequate volume  Final   Culture  Setup Time   Final    GRAM POSITIVE COCCI IN CLUSTERS IN PEDIATRIC BOTTLE CRITICAL RESULT CALLED TO, READ BACK BY AND VERIFIED WITH: Peggyann Juba PHARMD 2317 01/21/17 A BROWNING    Culture   Final    GRAM POSITIVE COCCI IDENTIFICATION AND SUSCEPTIBILITIES TO FOLLOW Performed at [facility], [address] [place], [place] [address]    Report Status PENDING  Incomplete  Blood Culture ID Panel (Reflexed)     Status: Abnormal   Collection Time: 01/20/17  1:15 PM  Result Value Ref Range Status   Enterococcus species NOT DETECTED NOT DETECTED Final   Listeria monocytogenes NOT DETECTED NOT DETECTED Final   Staphylococcus species DETECTED (A) NOT DETECTED Final    Comment: CRITICAL RESULT CALLED TO, READ BACK BY AND VERIFIED WITH: Peggyann Juba PHARMD 2317 01/21/17 A BROWNING    Staphylococcus aureus DETECTED (A) NOT DETECTED Final    Comment: Methicillin (oxacillin) susceptible Staphylococcus aureus (MSSA). Preferred therapy is anti staphylococcal beta lactam antibiotic (Cefazolin or Nafcillin), unless clinically contraindicated. CRITICAL RESULT CALLED TO, READ BACK BY AND VERIFIED WITH: Peggyann Juba PHARMD [id number] 01/21/17 A BROWNING    Methicillin resistance NOT DETECTED NOT DETECTED Final   Streptococcus species NOT DETECTED NOT  DETECTED Final   Streptococcus agalactiae NOT DETECTED NOT DETECTED Final   Streptococcus pneumoniae NOT DETECTED NOT DETECTED Final   Streptococcus pyogenes NOT DETECTED NOT DETECTED Final   Acinetobacter baumannii NOT DETECTED NOT DETECTED Final   Enterobacteriaceae species NOT DETECTED NOT DETECTED Final   Enterobacter cloacae complex NOT DETECTED NOT DETECTED Final   Escherichia coli NOT DETECTED NOT DETECTED Final  Klebsiella oxytoca NOT DETECTED NOT DETECTED Final   Klebsiella pneumoniae NOT DETECTED NOT DETECTED Final   Proteus species NOT DETECTED NOT DETECTED Final   Serratia marcescens NOT DETECTED NOT DETECTED Final   Haemophilus influenzae NOT DETECTED NOT DETECTED Final   Neisseria meningitidis NOT DETECTED NOT DETECTED Final   Pseudomonas aeruginosa NOT DETECTED NOT DETECTED Final   Candida albicans NOT DETECTED NOT DETECTED Final   Candida glabrata NOT DETECTED NOT DETECTED Final   Candida krusei NOT DETECTED NOT DETECTED Final   Candida parapsilosis NOT DETECTED NOT DETECTED Final   Candida tropicalis NOT DETECTED NOT DETECTED Final    Comment: Performed at [facility], [address] [place], [place] [address]     ASSESSMENT: He has chronic osteomyelitis of his first and second toes on his left foot complicated by transient MSSA bacteremia. I have reviewed the palliative care team note from earlier today. He mentions that he wants aggressive antibiotic therapy but is reluctant to go to a skilled nursing facility. I do not feel he is a candidate for outpatient IV antibiotic therapy through a PICC. If the decision is made to send him home I would convert him to oral amoxicillin clavulanate to complete a total of 6 weeks of therapy (through 03/04/2017). If he goes to a skilled nursing facility he can continue IV ampicillin sulbactam or convert to once daily ertapenem 1 g IV. I will be out of town tomorrow. Please call Dr. Enedina Finner [phone] any infectious  disease questions this weekend.  PLAN: 1. Continue ampicillin sulbactam for now  Cliffton Asters, MD [facility] [phone] pager   [phone] cell 01/23/2017, 4:47 PM

## 2017-01-24 ENCOUNTER — Inpatient Hospital Stay (HOSPITAL_COMMUNITY): Payer: Medicare Other

## 2017-01-24 DIAGNOSIS — I361 Nonrheumatic tricuspid (valve) insufficiency: Secondary | ICD-10-CM

## 2017-01-24 DIAGNOSIS — I10 Essential (primary) hypertension: Secondary | ICD-10-CM

## 2017-01-24 DIAGNOSIS — M6282 Rhabdomyolysis: Secondary | ICD-10-CM

## 2017-01-24 DIAGNOSIS — G9341 Metabolic encephalopathy: Secondary | ICD-10-CM

## 2017-01-24 LAB — CBC WITH DIFFERENTIAL/PLATELET
BASOS PCT: 0 %
Basophils Absolute: 0 10*3/uL (ref 0.0–0.1)
EOS ABS: 0 10*3/uL (ref 0.0–0.7)
EOS PCT: 0 %
HCT: 45.7 % (ref 39.0–52.0)
HEMOGLOBIN: 14.8 g/dL (ref 13.0–17.0)
LYMPHS ABS: 0.5 10*3/uL — AB (ref 0.7–4.0)
Lymphocytes Relative: 4 %
MCH: 30 pg (ref 26.0–34.0)
MCHC: 32.4 g/dL (ref 30.0–36.0)
MCV: 92.7 fL (ref 78.0–100.0)
MONOS PCT: 5 %
Monocytes Absolute: 0.5 10*3/uL (ref 0.1–1.0)
NEUTROS PCT: 91 %
Neutro Abs: 10.6 10*3/uL — ABNORMAL HIGH (ref 1.7–7.7)
Platelets: 199 10*3/uL (ref 150–400)
RBC: 4.93 MIL/uL (ref 4.22–5.81)
RDW: 14.2 % (ref 11.5–15.5)
WBC: 11.5 10*3/uL — AB (ref 4.0–10.5)

## 2017-01-24 LAB — ECHOCARDIOGRAM COMPLETE
HEIGHTINCHES: 72 in
Weight: 2960 oz

## 2017-01-24 LAB — BASIC METABOLIC PANEL
Anion gap: 10 (ref 5–15)
BUN: 28 — AB (ref 4–21)
BUN: 28 mg/dL — AB (ref 6–20)
CALCIUM: 8.7 mg/dL — AB (ref 8.9–10.3)
CHLORIDE: 112 mmol/L — AB (ref 101–111)
CO2: 26 mmol/L (ref 22–32)
CREATININE: 1.3 (ref 0.6–1.3)
CREATININE: 1.25 mg/dL — AB (ref 0.61–1.24)
GFR calc non Af Amer: 49 mL/min — ABNORMAL LOW (ref >60)
GFR, EST AFRICAN AMERICAN: 57 mL/min — AB (ref >60)
Glucose, Bld: 166 mg/dL — ABNORMAL HIGH (ref 65–99)
Glucose: 166
Potassium: 4.2 mmol/L (ref 3.5–5.1)
SODIUM: 148 — AB (ref 137–147)
SODIUM: 148 mmol/L — AB (ref 135–145)

## 2017-01-24 LAB — CBC AND DIFFERENTIAL: WBC: 11.5

## 2017-01-24 MED ORDER — SODIUM CHLORIDE 0.9% FLUSH: 10.0000 mL | INTRAVENOUS | Status: DC | PRN

## 2017-01-24 NOTE — Progress Notes (Signed)
CSW spoke with SNF liaison at [facility], pt daughter completed admissions paperwork.  Today patient is receiving a PICC for IV antibiotics per physician  Patient will d/c in the a.m  Vivi BarrackNicole Keiran Gaffey, Theresia MajorsLCSWA, MSW Clinical Social Worker 5E and Psychiatric Service Line [phone] 01/24/2017  12:30 PM

## 2017-01-24 NOTE — Clinical Social Work Placement (Signed)
   CLINICAL SOCIAL WORK PLACEMENT  NOTE  Date:  01/24/2017  Patient Details  Name: [name] [name] MRN: [id number] Date of Birth: June 09, 1924  Clinical Social Work is seeking post-discharge placement for this patient at the Skilled  Nursing Facility level of care (*CSW will initial, date and re-position this form in  chart as items are completed):  Yes   Patient/family provided with [facility] Clinical Social Work Department's list of facilities offering this level of care within the geographic area requested by the patient (or if unable, by the patient's family).  Yes   Patient/family informed of their freedom to choose among providers that offer the needed level of care, that participate in Medicare, Medicaid or managed care program needed by the patient, have an available bed and are willing to accept the patient.  Yes   Patient/family informed of [facility]'s ownership interest in [facility] and [facility], as well as of the fact that they are under no obligation to receive care at these facilities.  PASRR submitted to EDS on 01/23/17     PASRR number received on 01/23/17     Existing PASRR number confirmed on       FL2 transmitted to all facilities in geographic area requested by pt/family on       FL2 transmitted to all facilities within larger geographic area on 01/24/17     Patient informed that his/her managed care company has contracts with or will negotiate with certain facilities, including the following:  [facility]     Yes   Patient/family informed of bed offers received.  Patient chooses bed at [facility]     Physician recommends and patient chooses bed at      Patient to be transferred to [facility] on  .  Patient to be transferred to facility by [facility]      Patient family notified on   of transfer.  Name of family member notified:  Daughter-Sharon     PHYSICIAN Please sign DNR, Please prepare  priority discharge summary, including medications     Additional Comment:    _______________________________________________ Clearance CootsNicole A Sriyan Cutting, LCSW 01/24/2017, 9:52 AM

## 2017-01-24 NOTE — Progress Notes (Signed)
  Echocardiogram 2D Echocardiogram has been performed.  [name] [name], [name] [name] 01/24/2017, 1:50 PM

## 2017-01-24 NOTE — Progress Notes (Signed)
PT Cancellation Note  Patient Details Name: [name] DownerSherman E Kiene MRN: [id number] DOB: Nov 05, 1923   Cancelled Treatment:    Reason Eval/Treat Not Completed: Fatigue/lethargy limiting ability to participate (DEEPLY ASLEEP, REQUIRED Medication  LAST PM FOR AGITATION. CHECK BACK WHEN ABLE TO PARTICIPATE. )   Rada HayHill, Abreanna Drawdy Elizabeth 01/24/2017, 8:44 AM Blanchard KelchKaren Chattie Greeson PT [phone]

## 2017-01-24 NOTE — Consult Note (Signed)
WOC Nurse wound consult note Reason for Consult: Patient with skin failure, Unstageable pressure injuries to the left hip and left lateral LE (distal to knee).  Also previously noted full thickness wounds on left foot. All wounds are not likely to heal so the goal of care is maintenance and prevention of further deterioration/infection if possible. Wound type: Pressure, Braden Scale Score is 13. Pressure Injury POA: Yes Measurement: Left hip Unstageable Pressure Injury measures 10cm x 10cm with central eschar measuring 4.5cm x 5cm. Left lateral Unstageable LE pressure injury measures 8cm x 6cm with central  eschar measuring 2cm x 1cm. For measurements of left foot full thickness wounds, please see WOC Nurse note dated 01/20/17. Wound bed: Left lateral LE and Left hip Unstageable Pressure injuries are red and moist at the periphery and black soft eschar in the center. Drainage (amount, consistency, odor) Scant to small amounts of light tan exudate noted, the odor consistent with necrotic tissue Periwound: Intact, dry. Dressing procedure/placement/frequency: I will implement conservative dressing changes to all wounds at this time, including the feet, discontinuing the alginate dressing in favor of twice daily saline dressings to all wounds except the left lateral LE.  For this wound, we will use a white petrolatum gauze wound contact layer. Patient is being turned and repositioned from side to side and skin care is being performed at least twice daily (more often in the genital and buttock areas due to incontinence). It is noted that a Palliative Care consult has been requested. As all wounds are on the left side and patient can be positioned off of those with right side and supine positioning, I will not place patient on a mattress replacement with low air loss feature.  I will however, provide bilateral pressure redistribution heel boots as he has spontaneous "marching" movements that are placing him at risk  for friction injuries to the feet. WOC nursing team will not follow, but will remain available to this patient, the nursing and medical teams.  Please re-consult if needed. Thanks, Ladona MowLaurie Tais Koestner, MSN, RN, GNP, Hans EdenCWOCN, CWON-AP, FAAN  Pager# [phone]

## 2017-01-24 NOTE — Progress Notes (Signed)
Peripherally Inserted Central Catheter/Midline Placement  The IV Nurse has discussed with the patient and/or persons authorized to consent for the patient, the purpose of this procedure and the potential benefits and risks involved with this procedure.  The benefits include less needle sticks, lab draws from the catheter, and the patient may be discharged home with the catheter. Risks include, but not limited to, infection, bleeding, blood clot (thrombus formation), and puncture of an artery; nerve damage and irregular heartbeat and possibility to perform a PICC exchange if needed/ordered by physician.  Alternatives to this procedure were also discussed.  Bard Power PICC patient education guide, fact sheet on infection prevention and patient information card has been provided to patient /or left at bedside.    PICC/Midline Placement Documentation     Telephone consent signed by Daughter   Maximino GreenlandLumban, Ayen Viviano Albarece 01/24/2017, 12:52 PM

## 2017-01-24 NOTE — Progress Notes (Signed)
CSW spoke with patient daughter [facility]. CSW provided liaison contact number to arrange time.  CSW will continue to assist with discharge.   Vivi BarrackNicole Zigmund Linse, Theresia MajorsLCSWA, MSW Clinical Social Worker 5E and Psychiatric Service Line [phone] 01/24/2017  9:48 AM

## 2017-01-24 NOTE — Progress Notes (Signed)
Date:  January 24, 2017  Chart reviewed for concurrent status and case management needs.  Will continue to follow patient progress.  Principal Problem:   Staphylococcus aureus bacteremia Active Problems:   Sepsis (HCC)   Osteomyelitis of toe of left foot (HCC)   Essential hypertension   PVD   COPD (chronic obstructive pulmonary disease) (HCC)   Foot ulcer (HCC)   Hypernatremia   Rhabdomyolysis   AKI (acute kidney injury) (HCC)   Protein-calorie malnutrition, severe   Memory loss   Dehydration, severe   Moderate protein-calorie malnutrition (HCC)   Goals of care, counseling/discussion   Palliative care by specialist  Discharge Planning: following for needs  Expected discharge date: [id number]  Marcelle SmilingRhonda Danity Schmelzer, BSN, WoodmereRN3, [place]CCM   [phone]

## 2017-01-24 NOTE — CV Procedure (Signed)
2D echo attempted, but patient in sterile procedure. Will try echo leter

## 2017-01-24 NOTE — Progress Notes (Signed)
OT Cancellation Note  Patient Details Name: [name] [name] MRN: [id number] DOB: 1923-10-16   Cancelled Treatment:    Reason Eval/Treat Not Completed: Other (comment). Checked on pt at 1300.  He was still sleeping soundly.  Noted medications after agitation episode.  Will check back another time.  Iridessa Harrow 01/24/2017, 2:19 PM  Marica OtterMaryellen Amyr Sluder, OTR/L [phone] 01/24/2017

## 2017-01-24 NOTE — Progress Notes (Signed)
SLP Cancellation Note  Patient Details Name: [name] [name] MRN: [id number] DOB: 21-Mar-1924   Cancelled treatment:       Reason Eval/Treat Not Completed: Fatigue/lethargy limiting ability to participate; pt not arousable for participation.  Will follow.   Blenda MountsCouture, Heywood Tokunaga Laurice 01/24/2017, 4:26 PM

## 2017-01-24 NOTE — Progress Notes (Signed)
PROGRESS NOTE    Jenetta DownerSherman E Woon  ZOX:[id number] DOB: 1924/01/30 DOA: 01/20/2017 PCP: Gaspar Garbeisovec, Richard W, MD    Brief Narrative:  83 yo male presented with altered mental status. Patient known to have HTN and asthma. Has developed progressive confusion and weakness, found on the floor by his family. On the initial examination he was lethargic, blood pressure 161/88, RR 20, HR 95 with oxygen saturation 96%. Oral mucosa moist, lungs clear to auscultation, heart tachycardic, abdomen soft and non tender, lower extremities with left foot, 1st toe with purulent ulcerated lesion, plus other multiple wounds. Left foot x ray with acute osteomyelitis on the first and second toes. Patient was admitted for further treatment.    Assessment & Plan:   Principal Problem:   Staphylococcus aureus bacteremia Active Problems:   Essential hypertension   PVD   COPD (chronic obstructive pulmonary disease) (HCC)   Sepsis (HCC)   Foot ulcer (HCC)   Hypernatremia   Rhabdomyolysis   AKI (acute kidney injury) (HCC)   Protein-calorie malnutrition, severe   Osteomyelitis of toe of left foot (HCC)   Memory loss   Dehydration, severe   Moderate protein-calorie malnutrition (HCC)   Goals of care, counseling/discussion   Palliative care by specialist   1. Left foot osteomyelitis with staphylococcus aureus bacteremia. Patient responding well to antibiotic therapy with Unasyn, disposition to SNF, will plan to complete antibiotic therapy with IV Unasyn. Will follow echocardiogram. Order picc line for long term antibiotic therapy.   2. Metabolic encephalopathy. Worsening encephalopathy, patient received haldol and lorazepam last night, this am patient lethargic and unresponsive, does not follow commands. Will hold on further benzodiazepines, will continue haldol, hydration with Dextrose and saline. Neuro checks per unit protocol.   3. HTN. On hydralazine as needed. Gentle hydration with IV fluids. d5 half saline.  Blood pressure 139 to 142 systolic.   4. Calorie protein malnutrition. This am not able to eat due to encephalopathy. Will continue feeding supplements as tolerated.    5. Hypernatremia and hyperchloremia. Serum Na up to 148 and Cl at 112, will continue hydration with half normal saline at 75 cc per hour. Cr at 1,25 from 1,16, suspected base line at 1,3.   6. AKI on CKD stage 3. Calculated GFR base on basal cr of 1,3 is 42. Will continue hydration with IV fluids, avoid hypotension and nephrotoxic medications.   7. Rhabdomyolysis. Total cpk trending down to 226 from peak at 1,053, will continue gentle hydration, renal functio with stable Cr at 1,2 to 1,3.    DVT prophylaxis: enoxaparin  Code Status: full  Family Communication: No family at the bedside  Disposition Plan: SNF   Consultants:   I.D  Procedures:    Antimicrobials:   Unasyn     Subjective: Patient with worsening agitation and confusion, had lorazepam and haldol last night, This am is not responsive to voice or touch.   Objective: Vitals:   01/23/17 0501 01/23/17 1500 01/23/17 2020 01/24/17 0450  BP: (!) 144/57 (!) 139/51 (!) 154/80 (!) 142/88  Pulse: 76 87 94   Resp: 20 18 20 20   Temp: 97.6 F (36.4 C) 98 F (36.7 C) 97.7 F (36.5 C)   TempSrc: Oral Oral Oral   SpO2: 98% 98% 98% 91%  Weight:      Height:        Intake/Output Summary (Last 24 hours) at 01/24/17 1543 Last data filed at 01/24/17 1308  Gross per 24 hour  Intake  1500 ml  Output                1 ml  Net             1499 ml   Filed Weights   01/20/17 1046  Weight: 83.9 kg (185 lb)    Examination:  General exam: deconditioned E ENT: Mild pallor, oral mucosa dry.  Respiratory system: No wheezing, rales or rhonchi. Respiratory effort normal. Cardiovascular system: S1 & S2 heard, RRR. No JVD, murmurs, rubs, gallops or clicks. No pedal edema. Gastrointestinal system: Abdomen is nondistended, soft and nontender. No  organomegaly or masses felt. Normal bowel sounds heard. Central nervous system: Poor responsive to touch or voice. No focal neurological deficits. Extremities: Symmetric 5 x 5 power. Skin: No rashes, lesions or ulcers .     Data Reviewed: I have personally reviewed following labs and imaging studies  CBC:  Recent Labs Lab 01/20/17 1119 01/21/17 0633 01/22/17 1002 01/23/17 0657 01/24/17 0633  WBC 14.8* 11.5* 9.9 8.0 11.5*  NEUTROABS  --   --  8.5* 6.4 10.6*  HGB 17.8* 15.3 14.3 13.3 14.8  HCT 54.2* 47.9 45.2 42.3 45.7  MCV 92.0 94.3 94.4 94.2 92.7  PLT 291 201 227 199 199   Basic Metabolic Panel:  Recent Labs Lab 01/20/17 1119 01/21/17 0633 01/22/17 1002 01/23/17 0657 01/24/17 0633  NA 150* 151* 152* 147* 148*  K 4.8 4.1 3.3* 4.1 4.2  CL 107 117* 117* 113* 112*  CO2 26 23 28 30 26   GLUCOSE 143* 94 107* 125* 166*  BUN 67* 52* 40* 32* 28*  CREATININE 2.17* 1.49* 1.35* 1.16 1.25*  CALCIUM 9.2 8.6* 8.4* 8.2* 8.7*   GFR: Estimated Creatinine Clearance: 43.1 mL/min (A) (by C-G formula based on SCr of 1.25 mg/dL (H)). Liver Function Tests:  Recent Labs Lab 01/20/17 1119  AST 92*  ALT 35  ALKPHOS 79  BILITOT 1.0  PROT 7.7  ALBUMIN 2.9*   No results for input(s): LIPASE, AMYLASE in the last 168 hours. No results for input(s): AMMONIA in the last 168 hours. Coagulation Profile: No results for input(s): INR, PROTIME in the last 168 hours. Cardiac Enzymes:  Recent Labs Lab 01/20/17 1119 01/21/17 0633 01/22/17 0719 01/23/17 0657  CKTOTAL 2,166* 1,053* 436* 226   BNP (last 3 results) No results for input(s): PROBNP in the last 8760 hours. HbA1C: No results for input(s): HGBA1C in the last 72 hours. CBG: No results for input(s): GLUCAP in the last 168 hours. Lipid Profile: No results for input(s): CHOL, HDL, LDLCALC, TRIG, CHOLHDL, LDLDIRECT in the last 72 hours. Thyroid Function Tests: No results for input(s): TSH, T4TOTAL, FREET4, T3FREE, THYROIDAB  in the last 72 hours. Anemia Panel: No results for input(s): VITAMINB12, FOLATE, FERRITIN, TIBC, IRON, RETICCTPCT in the last 72 hours. Sepsis Labs:  Recent Labs Lab 01/20/17 1321 01/20/17 1650  LATICACIDVEN 2.97* 1.7    Recent Results (from the past 240 hour(s))  Urine culture     Status: Abnormal   Collection Time: 01/20/17 11:19 AM  Result Value Ref Range Status   Specimen Description URINE, CLEAN CATCH  Final   Special Requests NONE  Final   Culture >=100,000 COLONIES/mL STAPHYLOCOCCUS AUREUS (A)  Final   Report Status 01/22/2017 FINAL  Final   Organism ID, Bacteria STAPHYLOCOCCUS AUREUS (A)  Final      Susceptibility   Staphylococcus aureus - MIC*    CIPROFLOXACIN <=0.5 SENSITIVE Sensitive     GENTAMICIN <=0.5 SENSITIVE Sensitive  NITROFURANTOIN <=16 SENSITIVE Sensitive     OXACILLIN 0.5 SENSITIVE Sensitive     TETRACYCLINE <=1 SENSITIVE Sensitive     VANCOMYCIN <=0.5 SENSITIVE Sensitive     TRIMETH/SULFA <=10 SENSITIVE Sensitive     CLINDAMYCIN <=0.25 SENSITIVE Sensitive     RIFAMPIN <=0.5 SENSITIVE Sensitive     Inducible Clindamycin NEGATIVE Sensitive     * >=100,000 COLONIES/mL STAPHYLOCOCCUS AUREUS  Blood Culture (routine x 2)     Status: None (Preliminary result)   Collection Time: 01/20/17  1:15 PM  Result Value Ref Range Status   Specimen Description BLOOD LEFT HAND  Final   Special Requests IN PEDIATRIC BOTTLE Blood Culture adequate volume  Final   Culture   Final    NO GROWTH 4 DAYS Performed at [facility], [address] [place], [place] [address]    Report Status PENDING  Incomplete  Blood Culture (routine x 2)     Status: Abnormal (Preliminary result)   Collection Time: 01/20/17  1:15 PM  Result Value Ref Range Status   Specimen Description BLOOD RIGHT HAND  Final   Special Requests IN PEDIATRIC BOTTLE Blood Culture adequate volume  Final   Culture  Setup Time   Final    GRAM POSITIVE COCCI IN CLUSTERS IN PEDIATRIC BOTTLE CRITICAL  RESULT CALLED TO, READ BACK BY AND VERIFIED WITH: Peggyann Juba PHARMD [id number] 01/21/17 A BROWNING    Culture (A)  Final    STAPHYLOCOCCUS SPECIES (COAGULASE NEGATIVE) THE SIGNIFICANCE OF ISOLATING THIS ORGANISM FROM A SINGLE SET OF BLOOD CULTURES WHEN MULTIPLE SETS ARE DRAWN IS UNCERTAIN. PLEASE NOTIFY THE MICROBIOLOGY DEPARTMENT WITHIN ONE WEEK IF SPECIATION AND SENSITIVITIES ARE REQUIRED. STAPHYLOCOCCUS AUREUS SUSCEPTIBILITIES TO FOLLOW Performed at [facility], [address] [place], [place] [address]    Report Status PENDING  Incomplete  Blood Culture ID Panel (Reflexed)     Status: Abnormal   Collection Time: 01/20/17  1:15 PM  Result Value Ref Range Status   Enterococcus species NOT DETECTED NOT DETECTED Final   Listeria monocytogenes NOT DETECTED NOT DETECTED Final   Staphylococcus species DETECTED (A) NOT DETECTED Final    Comment: CRITICAL RESULT CALLED TO, READ BACK BY AND VERIFIED WITH: Peggyann Juba PHARMD [id number] 01/21/17 A BROWNING    Staphylococcus aureus DETECTED (A) NOT DETECTED Final    Comment: Methicillin (oxacillin) susceptible Staphylococcus aureus (MSSA). Preferred therapy is anti staphylococcal beta lactam antibiotic (Cefazolin or Nafcillin), unless clinically contraindicated. CRITICAL RESULT CALLED TO, READ BACK BY AND VERIFIED WITH: Peggyann Juba PHARMD [id number] 01/21/17 A BROWNING    Methicillin resistance NOT DETECTED NOT DETECTED Final   Streptococcus species NOT DETECTED NOT DETECTED Final   Streptococcus agalactiae NOT DETECTED NOT DETECTED Final   Streptococcus pneumoniae NOT DETECTED NOT DETECTED Final   Streptococcus pyogenes NOT DETECTED NOT DETECTED Final   Acinetobacter baumannii NOT DETECTED NOT DETECTED Final   Enterobacteriaceae species NOT DETECTED NOT DETECTED Final   Enterobacter cloacae complex NOT DETECTED NOT DETECTED Final   Escherichia coli NOT DETECTED NOT DETECTED Final   Klebsiella oxytoca NOT DETECTED NOT DETECTED Final   Klebsiella pneumoniae  NOT DETECTED NOT DETECTED Final   Proteus species NOT DETECTED NOT DETECTED Final   Serratia marcescens NOT DETECTED NOT DETECTED Final   Haemophilus influenzae NOT DETECTED NOT DETECTED Final   Neisseria meningitidis NOT DETECTED NOT DETECTED Final   Pseudomonas aeruginosa NOT DETECTED NOT DETECTED Final   Candida albicans NOT DETECTED NOT DETECTED Final   Candida glabrata  NOT DETECTED NOT DETECTED Final   Candida krusei NOT DETECTED NOT DETECTED Final   Candida parapsilosis NOT DETECTED NOT DETECTED Final   Candida tropicalis NOT DETECTED NOT DETECTED Final    Comment: Performed at [facility], [address], [place], [place] [address]  Culture, blood (routine x 2)     Status: None (Preliminary result)   Collection Time: 01/23/17 10:48 AM  Result Value Ref Range Status   Specimen Description BLOOD RIGHT ANTECUBITAL  Final   Special Requests IN PEDIATRIC BOTTLE Blood Culture adequate volume  Final   Culture   Final    NO GROWTH < 24 HOURS Performed at [facility], [address] [place], [place] [address]    Report Status PENDING  Incomplete  Culture, blood (routine x 2)     Status: None (Preliminary result)   Collection Time: 01/23/17 10:48 AM  Result Value Ref Range Status   Specimen Description BLOOD RIGHT HAND  Final   Special Requests IN PEDIATRIC BOTTLE Blood Culture adequate volume  Final   Culture   Final    NO GROWTH < 24 HOURS Performed at [facility], [address], [place], [place] [address]    Report Status PENDING  Incomplete         Radiology Studies: No results found.      Scheduled Meds: . enoxaparin (LOVENOX) injection  40 mg Subcutaneous Q24H  . feeding supplement (ENSURE ENLIVE)  237 mL Oral BID BM  . nystatin  5 mL Oral QID  . senna  1 tablet Oral QHS   Continuous Infusions: . ampicillin-sulbactam (UNASYN) IV 3 g (01/24/17 1351)  . dextrose 5 % and 0.45% NaCl 75 mL/hr at 01/23/17 1416     LOS: 4 days       Mauricio Annett Gula, MD [facility] Pager [phone]  If 7PM-7AM, please contact night-coverage [email or web address] Password [facility] 01/24/2017, 3:43 PM

## 2017-01-24 NOTE — Progress Notes (Signed)
PHARMACY CONSULT NOTE FOR:  OUTPATIENT  PARENTERAL ANTIBIOTIC THERAPY (OPAT)  Indication: SA bacteremia, osteomyelitis of left toe Regimen: Unasyn 3gm IV Q6h End date: 03/04/17  IV antibiotic discharge orders are pended. To discharging provider:  please sign these orders via discharge navigator,  Select New Orders & click on the button choice - Manage This Unsigned Work.     Thank you for allowing pharmacy to be a part of this patient's care.  Elson ClanLilliston, Imari Sivertsen Michelle 01/24/2017, 11:07 AM

## 2017-01-25 DIAGNOSIS — E860 Dehydration: Secondary | ICD-10-CM | POA: Diagnosis not present

## 2017-01-25 DIAGNOSIS — G9341 Metabolic encephalopathy: Secondary | ICD-10-CM | POA: Diagnosis not present

## 2017-01-25 DIAGNOSIS — E878 Other disorders of electrolyte and fluid balance, not elsewhere classified: Secondary | ICD-10-CM | POA: Diagnosis not present

## 2017-01-25 DIAGNOSIS — R2681 Unsteadiness on feet: Secondary | ICD-10-CM | POA: Diagnosis not present

## 2017-01-25 DIAGNOSIS — E43 Unspecified severe protein-calorie malnutrition: Secondary | ICD-10-CM

## 2017-01-25 DIAGNOSIS — M6282 Rhabdomyolysis: Secondary | ICD-10-CM | POA: Diagnosis not present

## 2017-01-25 DIAGNOSIS — I129 Hypertensive chronic kidney disease with stage 1 through stage 4 chronic kidney disease, or unspecified chronic kidney disease: Secondary | ICD-10-CM | POA: Diagnosis not present

## 2017-01-25 DIAGNOSIS — N179 Acute kidney failure, unspecified: Secondary | ICD-10-CM | POA: Diagnosis not present

## 2017-01-25 DIAGNOSIS — I110 Hypertensive heart disease with heart failure: Secondary | ICD-10-CM | POA: Diagnosis not present

## 2017-01-25 DIAGNOSIS — I739 Peripheral vascular disease, unspecified: Secondary | ICD-10-CM

## 2017-01-25 DIAGNOSIS — M1991 Primary osteoarthritis, unspecified site: Secondary | ICD-10-CM | POA: Diagnosis not present

## 2017-01-25 DIAGNOSIS — R2689 Other abnormalities of gait and mobility: Secondary | ICD-10-CM | POA: Diagnosis not present

## 2017-01-25 DIAGNOSIS — R41841 Cognitive communication deficit: Secondary | ICD-10-CM | POA: Diagnosis not present

## 2017-01-25 DIAGNOSIS — E870 Hyperosmolality and hypernatremia: Secondary | ICD-10-CM | POA: Diagnosis not present

## 2017-01-25 DIAGNOSIS — R1311 Dysphagia, oral phase: Secondary | ICD-10-CM | POA: Diagnosis not present

## 2017-01-25 DIAGNOSIS — M6281 Muscle weakness (generalized): Secondary | ICD-10-CM | POA: Diagnosis not present

## 2017-01-25 DIAGNOSIS — N183 Chronic kidney disease, stage 3 (moderate): Secondary | ICD-10-CM | POA: Diagnosis not present

## 2017-01-25 DIAGNOSIS — E440 Moderate protein-calorie malnutrition: Secondary | ICD-10-CM | POA: Diagnosis not present

## 2017-01-25 DIAGNOSIS — R4182 Altered mental status, unspecified: Secondary | ICD-10-CM | POA: Diagnosis not present

## 2017-01-25 DIAGNOSIS — N170 Acute kidney failure with tubular necrosis: Secondary | ICD-10-CM | POA: Diagnosis not present

## 2017-01-25 DIAGNOSIS — R7881 Bacteremia: Secondary | ICD-10-CM | POA: Diagnosis not present

## 2017-01-25 DIAGNOSIS — A4101 Sepsis due to Methicillin susceptible Staphylococcus aureus: Secondary | ICD-10-CM | POA: Diagnosis not present

## 2017-01-25 DIAGNOSIS — I10 Essential (primary) hypertension: Secondary | ICD-10-CM | POA: Diagnosis not present

## 2017-01-25 DIAGNOSIS — L97524 Non-pressure chronic ulcer of other part of left foot with necrosis of bone: Secondary | ICD-10-CM | POA: Diagnosis not present

## 2017-01-25 DIAGNOSIS — M869 Osteomyelitis, unspecified: Secondary | ICD-10-CM

## 2017-01-25 DIAGNOSIS — B999 Unspecified infectious disease: Secondary | ICD-10-CM | POA: Diagnosis not present

## 2017-01-25 DIAGNOSIS — R748 Abnormal levels of other serum enzymes: Secondary | ICD-10-CM | POA: Diagnosis not present

## 2017-01-25 DIAGNOSIS — I5022 Chronic systolic (congestive) heart failure: Secondary | ICD-10-CM | POA: Diagnosis not present

## 2017-01-25 LAB — CULTURE, BLOOD (ROUTINE X 2)
Culture: NO GROWTH
SPECIAL REQUESTS: ADEQUATE
Special Requests: ADEQUATE

## 2017-01-25 MED ORDER — ACETAMINOPHEN 325 MG PO TABS: 650.0000 mg | ORAL_TABLET | Freq: Four times a day (QID) | ORAL | 0 refills | Status: AC | PRN

## 2017-01-25 MED ORDER — SENNA 8.6 MG PO TABS: 1.0000 | ORAL_TABLET | Freq: Every day | ORAL | 0 refills | Status: AC

## 2017-01-25 MED ORDER — BISACODYL 10 MG RE SUPP: 10.0000 mg | Freq: Every day | RECTAL | 0 refills | Status: AC | PRN

## 2017-01-25 MED ORDER — SODIUM CHLORIDE 0.9 % IV SOLN
1.0000 g | INTRAVENOUS | Status: DC
  Filled 2017-01-25: qty 1

## 2017-01-25 MED ORDER — SODIUM CHLORIDE 0.9 % IV SOLN: 3.0000 g | Freq: Four times a day (QID) | INTRAVENOUS | 0 refills | Status: AC

## 2017-01-25 MED ORDER — ENSURE ENLIVE PO LIQD: 237.0000 mL | Freq: Two times a day (BID) | ORAL | 12 refills | Status: AC

## 2017-01-25 MED ORDER — SODIUM CHLORIDE 0.9 % IV SOLN
3.0000 g | Freq: Four times a day (QID) | INTRAVENOUS | Status: DC
  Filled 2017-01-25 (×2): qty 3

## 2017-01-25 NOTE — Progress Notes (Signed)
Called report to nurse Bonita [facility]Linda [facility]. Reviewed HPI, PMH, recent labs and vitals, wounds and wound care, diet, and most recent bowel movement. Informed her that the patient will be coming with a PICC line and getting Unasyn QID. Patient to transfer to SNF via [facility] ambulance service. Will continue to monitor until time of discharge.

## 2017-01-25 NOTE — Progress Notes (Signed)
Patient is set to [facility] today. Patient & daughter, Jasmine DecemberSharon, aware. Discharge packet given to RN. [facility] called for transport.  Number for report: [phone]   Stacy GardnerErin Likisha Alles, [facility] Memorial HospitalCSWA Clinical Social Worker [phone]

## 2017-01-25 NOTE — Discharge Summary (Signed)
Physician Discharge Summary  [name] [name] NGE:[id number] DOB: Feb 06, 1924 DOA: 01/20/2017  PCP: Haywood Pao, MD  Admit date: 01/20/2017 Discharge date: 01/25/2017  Admitted From: Home  Disposition:  SNF   Recommendations for Outpatient Follow-up:  1. Follow up with PCP in 1- week 2. Patient placed on Unasyn, last dose on 03/04/2017 3. Please remove Picc line after last dose 4. Follow weekly BMP, ESR and CRP  Home Health: NA Equipment/Devices: No   Discharge Condition: Stable  CODE STATUS: DNR Diet recommendation: Regular  Brief/Interim Summary: 83 yo male presented with altered mental status. Patient known to have HTN and asthma. Has developed progressive confusion and weakness, found on the floor by his family. On the initial examination he was lethargic, blood pressure 161/88, RR 20, HR 95 with oxygen saturation 96%. Oral mucosa moist, lungs clear to auscultation, heart tachycardic, abdomen soft and non tender, lower extremities with left foot, 1st toe with purulent ulcerated lesion, plus other multiple wounds. Left foot x ray with acute osteomyelitis on the first and second toes.Sodium 150, potassium 4.8, chloride 107, bicarbonate 26, glucose 143, BUN 67, creatinine 2.17, white count 14.8, hemolyzed 17.8, hematocrit 54.2, platelets 291, CPK 2,166, urine with too numerous to count white cells, CT head with mild diffuse cortical atrophy. EKG normal sinus rhythm, positive premature ventricular complexes.   Patient was admitted to the hospital with the working diagnosis of sepsis due to left foot cellulitis rule out osteomyelitis, complicated by dehydration, rhabdomyolysis, hypernatremia, acute kidney injury, and acute metabolic encephalopathy.  1. Sepsis due to left foot osteomyelitis with Staphylococcus aureus (MSSA) bacteremia. Patient was admitted to the medical floor, he was placed on broad spectrum antibiotics including vancomycin, ceftriaxone and metronidazole. Urine culture  and blood cultures grew Staphylococcus aureus (MSSA). Patient was seen by infectious disease with recommendation to continue IV ampicillin/sulbactam. Follow-up blood cultures from June 7 were no growth, echocardiography, transthoracic, showed a left ventricle ejection fraction of 40-45% with diffuse hypokinesis, no evidence of vegetation. Patient was seen by palliative care team, patient was adamant about not getting amputation, decision was made to complete antibiotic therapy with IV antibiotics at the skilled nursing facility with Unasyn to finish on 03/04/2017. Follow weekly ESR and CRP  2. Metabolic encephalopathy. Patient had episodes of confusion and agitation during his hospital stay, he received benzodiazepines and Haldol, by the time of discharge he is awake and alert. Responding to simple questions appropriately. Patient was seen by physical therapy with recommendations for skilled nursing facility.  3. Hypertension. Patient's blood pressure remained well-controlled in the hospital, he had as needed hydralazine.  4. Calorie protein malnutrition. Patient was continued on nutritional supplements.  5. Hypernatremia and hyperchloremia. Patient was continued on hypo-tonic solutions with improvement of electrolytes. Discharge sodium 148, potassium 4.2, chloride 112, bicarbonate 26, creatinine 1.25.  6. Acute kidney injury on chronic kidney disease stage III. Patient received IV fluids, improvement of kidney function, old records personally reviewed with acreatinine 1.3 with capillary GFR 42. Patient will have weekly BMP while on IV antibiotic therapy.   7. Rhabdomyolysis. Significant improvement of CPK after medical supportive therapy with IV fluids, her kidney function back to its baseline. Patient will be discharged to skilled nursing facility.  8. Systolic heart failure, chronic and stable. Continue blood pressure monitoring, clinically patient euvolemic, will need close follow-up as an  outpatient. Echocardiography with diffuse hypokinesis, suggesting nonischemic cardiomyopathy. May benefit from beta blockade, will defer for outpatient reevaluation once severe infection resolved.    Discharge  Diagnoses:  Principal Problem:   Staphylococcus aureus bacteremia Active Problems:   Essential hypertension   PVD   COPD (chronic obstructive pulmonary disease) (HCC)   Sepsis (HCC)   Foot ulcer (HCC)   Hypernatremia   Rhabdomyolysis   AKI (acute kidney injury) ([place])   Protein-calorie malnutrition, severe   Osteomyelitis of toe of left foot (HCC)   Memory loss   Dehydration, severe   Moderate protein-calorie malnutrition ([place])   Goals of care, counseling/discussion   Palliative care by specialist    Discharge Instructions   Allergies as of 01/25/2017   No Known Allergies     Medication List    STOP taking these medications   ibuprofen 200 MG tablet Commonly known as:  ADVIL,MOTRIN     TAKE these medications   acetaminophen 325 MG tablet Commonly known as:  TYLENOL Take 2 tablets (650 mg total) by mouth every 6 (six) hours as needed for mild pain (or Fever >/= 101).   Ampicillin-Sulbactam 3 g in sodium chloride 0.9 % 100 mL Inject 3 g into the vein every 6 (six) hours. Last dose 03/04/2017 Please remove PICC line after last dose on 03/04/2017 Weekly BMP, ESR, CRP.   bisacodyl 10 MG suppository Commonly known as:  DULCOLAX Place 1 suppository (10 mg total) rectally daily as needed for mild constipation or moderate constipation.   feeding supplement (ENSURE ENLIVE) Liqd Take 237 mLs by mouth 2 (two) times daily between meals.   senna 8.6 MG Tabs tablet Commonly known as:  SENOKOT Take 1 tablet (8.6 mg total) by mouth at bedtime.      Contact information for after-discharge care    Destination    [facility] .   Specialty:  Skilled Nursing Facility Contact information: [address] [place]  [place] [phone]             No Known Allergies  Consultations:  Wound care  Palliative care  Infectious disease   Procedures/Studies: Ct Head Wo Contrast  Result Date: 01/20/2017 CLINICAL DATA:  Unwitnessed fall last night. EXAM: CT HEAD WITHOUT CONTRAST TECHNIQUE: Contiguous axial images were obtained from the base of the skull through the vertex without intravenous contrast. COMPARISON:  CT scan of September 22, 2015. FINDINGS: Brain: Mild diffuse cortical atrophy is noted No mass effect or midline shift is noted. Ventricular size is within normal limits. There is no evidence of mass lesion, hemorrhage or acute infarction. Vascular: Atherosclerosis of carotid siphons is noted. Skull: Normal. Negative for fracture or focal lesion. Sinuses/Orbits: No acute finding. Other: None. IMPRESSION: Mild diffuse cortical atrophy. No acute intracranial abnormality seen. Electronically Signed   By: Marijo Conception, M.D.   On: 01/20/2017 12:17   Dg Foot Complete Left  Result Date: 01/20/2017 CLINICAL DATA:  Left great toe ulcer. No known injury. Known peripheral vascular disease, former smoker. EXAM: LEFT FOOT - COMPLETE 3+ VIEW COMPARISON:  None in PACs FINDINGS: There is bony resorption of the entire distal phalanx of the second toe. There is partial resorption of the distal phalanx of the great toe as well as lateral subluxation of the base of the distal phalanx with respect to the head of the proximal phalanx of the great toe. The other phalanges appear intact. There is chronic bony resorption of the head of the second metatarsal and a portion of the base of the proximal phalanx of the second toe. There are vascular calcifications. There are plantar and Achilles region calcaneal spurs.  No tarsal bone abnormalities are observed. IMPRESSION: Findings worrisome for osteomyelitis of the great toe and second toe distally. Probable chronic arthropathic change of the second MTP joint. Electronically Signed    By: David  Martinique M.D.   On: 01/20/2017 15:33   Dg Hip Unilat With Pelvis 2-3 Views Left  Result Date: 01/21/2017 CLINICAL DATA:  Fall with left hip pain. EXAM: DG HIP (WITH OR WITHOUT PELVIS) 2-3V LEFT COMPARISON:  None. FINDINGS: No acute fracture or dislocation identified. There is osteoarthritis of the hip joint. Atherosclerotic calcifications are seen involving the femoral arteries. No bony lesions or destruction. IMPRESSION: No evidence of acute hip fracture. Osteoarthritis present of the left hip joint. Femoral atherosclerosis. Electronically Signed   By: Aletta Edouard M.D.   On: 01/21/2017 16:17      Subjective: Patient more awake and alert, following commands, no confusion or agitation, tolerating po well.   Discharge Exam: Vitals:   01/25/17 0506 01/25/17 0600  BP: 138/62   Pulse: 78   Resp: 20   Temp:  98.2 F (36.8 C)   Vitals:   01/24/17 0450 01/24/17 2215 01/25/17 0506 01/25/17 0600  BP: (!) 142/88 131/65 138/62   Pulse:   78   Resp: _0 Temp:  97.8 F (36.6 C)  98.2 F (36.8 C)  TempSrc:  Oral Oral Oral  SpO2: 91% 93% 98%   Weight:      Height:        General: Pt is alert, awake, not in acute distress E ENT: mild pallor, no icterus Cardiovascular: RRR, S1/S2 +, no rubs, no gallops Respiratory: CTA bilaterally, no wheezing, no rhonchi Abdominal: Soft, NT, ND, bowel sounds + Extremities: no edema, no cyanosis. Left foot with dressing in place, bilateral boots.     The results of significant diagnostics from this hospitalization (including imaging, microbiology, ancillary and laboratory) are listed below for reference.     Microbiology: Recent Results (from the past 240 hour(s))  Urine culture     Status: Abnormal   Collection Time: 01/20/17 11:19 AM  Result Value Ref Range Status   Specimen Description URINE, CLEAN CATCH  Final   Special Requests NONE  Final   Culture >=100,000 COLONIES/mL STAPHYLOCOCCUS AUREUS (A)  Final   Report Status  01/22/2017 FINAL  Final   Organism ID, Bacteria STAPHYLOCOCCUS AUREUS (A)  Final      Susceptibility   Staphylococcus aureus - MIC*    CIPROFLOXACIN <=0.5 SENSITIVE Sensitive     GENTAMICIN <=0.5 SENSITIVE Sensitive     NITROFURANTOIN <=16 SENSITIVE Sensitive     OXACILLIN 0.5 SENSITIVE Sensitive     TETRACYCLINE <=1 SENSITIVE Sensitive     VANCOMYCIN <=0.5 SENSITIVE Sensitive     TRIMETH/SULFA <=10 SENSITIVE Sensitive     CLINDAMYCIN <=0.25 SENSITIVE Sensitive     RIFAMPIN <=0.5 SENSITIVE Sensitive     Inducible Clindamycin NEGATIVE Sensitive     * >=100,000 COLONIES/mL STAPHYLOCOCCUS AUREUS  Blood Culture (routine x 2)     Status: None (Preliminary result)   Collection Time: 01/20/17  1:15 PM  Result Value Ref Range Status   Specimen Description BLOOD LEFT HAND  Final   Special Requests IN PEDIATRIC BOTTLE Blood Culture adequate volume  Final   Culture   Final    NO GROWTH 4 DAYS Performed at [facility], [address], [place], [place] [address]    Report Status PENDING  Incomplete  Blood Culture (routine x 2)  Status: Abnormal   Collection Time: 01/20/17  1:15 PM  Result Value Ref Range Status   Specimen Description BLOOD RIGHT HAND  Final   Special Requests IN PEDIATRIC BOTTLE Blood Culture adequate volume  Final   Culture  Setup Time   Final    GRAM POSITIVE COCCI IN CLUSTERS IN PEDIATRIC BOTTLE CRITICAL RESULT CALLED TO, READ BACK BY AND VERIFIED WITHLavell Luster PHARMD [id number] 01/21/17 A BROWNING Performed at [facility], [place] [address] [place], [place] [address]    Culture (A)  Final    STAPHYLOCOCCUS SPECIES (COAGULASE NEGATIVE) THE SIGNIFICANCE OF ISOLATING THIS ORGANISM FROM A SINGLE SET OF BLOOD CULTURES WHEN MULTIPLE SETS ARE DRAWN IS UNCERTAIN. PLEASE NOTIFY THE MICROBIOLOGY DEPARTMENT WITHIN ONE WEEK IF SPECIATION AND SENSITIVITIES ARE REQUIRED. STAPHYLOCOCCUS AUREUS    Report Status 01/25/2017 FINAL  Final   Organism ID, Bacteria STAPHYLOCOCCUS  AUREUS  Final      Susceptibility   Staphylococcus aureus - MIC*    CIPROFLOXACIN <=0.5 SENSITIVE Sensitive     ERYTHROMYCIN <=0.25 SENSITIVE Sensitive     GENTAMICIN <=0.5 SENSITIVE Sensitive     OXACILLIN 0.5 SENSITIVE Sensitive     TETRACYCLINE <=1 SENSITIVE Sensitive     VANCOMYCIN <=0.5 SENSITIVE Sensitive     TRIMETH/SULFA <=10 SENSITIVE Sensitive     CLINDAMYCIN <=0.25 SENSITIVE Sensitive     RIFAMPIN <=0.5 SENSITIVE Sensitive     Inducible Clindamycin NEGATIVE Sensitive     * STAPHYLOCOCCUS AUREUS  Blood Culture ID Panel (Reflexed)     Status: Abnormal   Collection Time: 01/20/17  1:15 PM  Result Value Ref Range Status   Enterococcus species NOT DETECTED NOT DETECTED Final   Listeria monocytogenes NOT DETECTED NOT DETECTED Final   Staphylococcus species DETECTED (A) NOT DETECTED Final    Comment: CRITICAL RESULT CALLED TO, READ BACK BY AND VERIFIED WITH: Lavell Luster PHARMD [id number] 01/21/17 A BROWNING    Staphylococcus aureus DETECTED (A) NOT DETECTED Final    Comment: Methicillin (oxacillin) susceptible Staphylococcus aureus (MSSA). Preferred therapy is anti staphylococcal beta lactam antibiotic (Cefazolin or Nafcillin), unless clinically contraindicated. CRITICAL RESULT CALLED TO, READ BACK BY AND VERIFIED WITH: Lavell Luster PHARMD [id number] 01/21/17 A BROWNING    Methicillin resistance NOT DETECTED NOT DETECTED Final   Streptococcus species NOT DETECTED NOT DETECTED Final   Streptococcus agalactiae NOT DETECTED NOT DETECTED Final   Streptococcus pneumoniae NOT DETECTED NOT DETECTED Final   Streptococcus pyogenes NOT DETECTED NOT DETECTED Final   Acinetobacter baumannii NOT DETECTED NOT DETECTED Final   Enterobacteriaceae species NOT DETECTED NOT DETECTED Final   Enterobacter cloacae complex NOT DETECTED NOT DETECTED Final   Escherichia coli NOT DETECTED NOT DETECTED Final   Klebsiella oxytoca NOT DETECTED NOT DETECTED Final   Klebsiella pneumoniae NOT DETECTED NOT DETECTED Final    Proteus species NOT DETECTED NOT DETECTED Final   Serratia marcescens NOT DETECTED NOT DETECTED Final   Haemophilus influenzae NOT DETECTED NOT DETECTED Final   Neisseria meningitidis NOT DETECTED NOT DETECTED Final   Pseudomonas aeruginosa NOT DETECTED NOT DETECTED Final   Candida albicans NOT DETECTED NOT DETECTED Final   Candida glabrata NOT DETECTED NOT DETECTED Final   Candida krusei NOT DETECTED NOT DETECTED Final   Candida parapsilosis NOT DETECTED NOT DETECTED Final   Candida tropicalis NOT DETECTED NOT DETECTED Final    Comment: Performed at [facility], [place] [address], [place], [place] [address]  Culture, blood (routine x 2)     Status:  None (Preliminary result)   Collection Time: 01/23/17 10:48 AM  Result Value Ref Range Status   Specimen Description BLOOD RIGHT ANTECUBITAL  Final   Special Requests IN PEDIATRIC BOTTLE Blood Culture adequate volume  Final   Culture   Final    NO GROWTH < 24 HOURS Performed at [facility] [address] [place], [place] [address]    Report Status PENDING  Incomplete  Culture, blood (routine x 2)     Status: None (Preliminary result)   Collection Time: 01/23/17 10:48 AM  Result Value Ref Range Status   Specimen Description BLOOD RIGHT HAND  Final   Special Requests IN PEDIATRIC BOTTLE Blood Culture adequate volume  Final   Culture   Final    NO GROWTH < 24 HOURS Performed at [facility], [place] [address], [place], [place] [address]    Report Status PENDING  Incomplete     Labs: BNP (last 3 results) No results for input(s): BNP in the last 8760 hours. Basic Metabolic Panel:  Recent Labs Lab 01/20/17 1119 01/21/17 0633 01/22/17 1002 01/23/17 0657 01/24/17 0633  NA 150* 151* 152* 147* 148*  K 4.8 4.1 3.3* 4.1 4.2  CL 107 117* 117* 113* 112*  CO2 _0 GLUCOSE 143* 94 107* 125* 166*  BUN 67* 52* 40* 32* 28*  CREATININE 2.17* 1.49* 1.35* 1.16 1.25*  CALCIUM 9.2 8.6* 8.4* 8.2* 8.7*   Liver  Function Tests:  Recent Labs Lab 01/20/17 1119  AST 92*  ALT 35  ALKPHOS 79  BILITOT 1.0  PROT 7.7  ALBUMIN 2.9*   No results for input(s): LIPASE, AMYLASE in the last 168 hours. No results for input(s): AMMONIA in the last 168 hours. CBC:  Recent Labs Lab 01/20/17 1119 01/21/17 0633 01/22/17 1002 01/23/17 0657 01/24/17 0633  WBC 14.8* 11.5* 9.9 8.0 11.5*  NEUTROABS  --   --  8.5* 6.4 10.6*  HGB 17.8* 15.3 14.3 13.3 14.8  HCT 54.2* 47.9 45.2 42.3 45.7  MCV 92.0 94.3 94.4 94.2 92.7  PLT 291 201 227 199 199   Cardiac Enzymes:  Recent Labs Lab 01/20/17 1119 01/21/17 0633 01/22/17 0719 01/23/17 0657  CKTOTAL 2,166* 1,053* 436* 226   BNP: Invalid input(s): POCBNP CBG: No results for input(s): GLUCAP in the last 168 hours. D-Dimer No results for input(s): DDIMER in the last 72 hours. Hgb A1c No results for input(s): HGBA1C in the last 72 hours. Lipid Profile No results for input(s): CHOL, HDL, LDLCALC, TRIG, CHOLHDL, LDLDIRECT in the last 72 hours. Thyroid function studies No results for input(s): TSH, T4TOTAL, T3FREE, THYROIDAB in the last 72 hours.  Invalid input(s): FREET3 Anemia work up No results for input(s): VITAMINB12, FOLATE, FERRITIN, TIBC, IRON, RETICCTPCT in the last 72 hours. Urinalysis    Component Value Date/Time   COLORURINE AMBER (A) 01/20/2017 1119   APPEARANCEUR CLOUDY (A) 01/20/2017 1119   LABSPEC 1.017 01/20/2017 1119   PHURINE 5.0 01/20/2017 1119   GLUCOSEU NEGATIVE 01/20/2017 1119   HGBUR MODERATE (A) 01/20/2017 1119   BILIRUBINUR NEGATIVE 01/20/2017 1119   KETONESUR 20 (A) 01/20/2017 1119   PROTEINUR 30 (A) 01/20/2017 1119   NITRITE NEGATIVE 01/20/2017 1119   LEUKOCYTESUR LARGE (A) 01/20/2017 1119   Sepsis Labs Invalid input(s): PROCALCITONIN,  WBC,  LACTICIDVEN Microbiology Recent Results (from the past 240 hour(s))  Urine culture     Status: Abnormal   Collection Time: 01/20/17 11:19 AM  Result Value Ref Range Status  Specimen Description URINE, CLEAN CATCH  Final   Special Requests NONE  Final   Culture >=100,000 COLONIES/mL STAPHYLOCOCCUS AUREUS (A)  Final   Report Status 01/22/2017 FINAL  Final   Organism ID, Bacteria STAPHYLOCOCCUS AUREUS (A)  Final      Susceptibility   Staphylococcus aureus - MIC*    CIPROFLOXACIN <=0.5 SENSITIVE Sensitive     GENTAMICIN <=0.5 SENSITIVE Sensitive     NITROFURANTOIN <=16 SENSITIVE Sensitive     OXACILLIN 0.5 SENSITIVE Sensitive     TETRACYCLINE <=1 SENSITIVE Sensitive     VANCOMYCIN <=0.5 SENSITIVE Sensitive     TRIMETH/SULFA <=10 SENSITIVE Sensitive     CLINDAMYCIN <=0.25 SENSITIVE Sensitive     RIFAMPIN <=0.5 SENSITIVE Sensitive     Inducible Clindamycin NEGATIVE Sensitive     * >=100,000 COLONIES/mL STAPHYLOCOCCUS AUREUS  Blood Culture (routine x 2)     Status: None (Preliminary result)   Collection Time: 01/20/17  1:15 PM  Result Value Ref Range Status   Specimen Description BLOOD LEFT HAND  Final   Special Requests IN PEDIATRIC BOTTLE Blood Culture adequate volume  Final   Culture   Final    NO GROWTH 4 DAYS Performed at [facility], [place] [address] [place], [place] [address]    Report Status PENDING  Incomplete  Blood Culture (routine x 2)     Status: Abnormal   Collection Time: 01/20/17  1:15 PM  Result Value Ref Range Status   Specimen Description BLOOD RIGHT HAND  Final   Special Requests IN PEDIATRIC BOTTLE Blood Culture adequate volume  Final   Culture  Setup Time   Final    GRAM POSITIVE COCCI IN CLUSTERS IN PEDIATRIC BOTTLE CRITICAL RESULT CALLED TO, READ BACK BY AND VERIFIED WITHLavell Luster PHARMD [id number] 01/21/17 A BROWNING Performed at [facility], [place] [address] [place], [place] [address]    Culture (A)  Final    STAPHYLOCOCCUS SPECIES (COAGULASE NEGATIVE) THE SIGNIFICANCE OF ISOLATING THIS ORGANISM FROM A SINGLE SET OF BLOOD CULTURES WHEN MULTIPLE SETS ARE DRAWN IS UNCERTAIN. PLEASE NOTIFY THE MICROBIOLOGY DEPARTMENT  WITHIN ONE WEEK IF SPECIATION AND SENSITIVITIES ARE REQUIRED. STAPHYLOCOCCUS AUREUS    Report Status 01/25/2017 FINAL  Final   Organism ID, Bacteria STAPHYLOCOCCUS AUREUS  Final      Susceptibility   Staphylococcus aureus - MIC*    CIPROFLOXACIN <=0.5 SENSITIVE Sensitive     ERYTHROMYCIN <=0.25 SENSITIVE Sensitive     GENTAMICIN <=0.5 SENSITIVE Sensitive     OXACILLIN 0.5 SENSITIVE Sensitive     TETRACYCLINE <=1 SENSITIVE Sensitive     VANCOMYCIN <=0.5 SENSITIVE Sensitive     TRIMETH/SULFA <=10 SENSITIVE Sensitive     CLINDAMYCIN <=0.25 SENSITIVE Sensitive     RIFAMPIN <=0.5 SENSITIVE Sensitive     Inducible Clindamycin NEGATIVE Sensitive     * STAPHYLOCOCCUS AUREUS  Blood Culture ID Panel (Reflexed)     Status: Abnormal   Collection Time: 01/20/17  1:15 PM  Result Value Ref Range Status   Enterococcus species NOT DETECTED NOT DETECTED Final   Listeria monocytogenes NOT DETECTED NOT DETECTED Final   Staphylococcus species DETECTED (A) NOT DETECTED Final    Comment: CRITICAL RESULT CALLED TO, READ BACK BY AND VERIFIED WITH: Lavell Luster PHARMD [id number] 01/21/17 A BROWNING    Staphylococcus aureus DETECTED (A) NOT DETECTED Final    Comment: Methicillin (oxacillin) susceptible Staphylococcus aureus (MSSA). Preferred therapy is anti staphylococcal beta lactam antibiotic (Cefazolin or Nafcillin), unless clinically contraindicated. CRITICAL  RESULT CALLED TO, READ BACK BY AND VERIFIED WITH: Lavell Luster PHARMD [id number] 01/21/17 A BROWNING    Methicillin resistance NOT DETECTED NOT DETECTED Final   Streptococcus species NOT DETECTED NOT DETECTED Final   Streptococcus agalactiae NOT DETECTED NOT DETECTED Final   Streptococcus pneumoniae NOT DETECTED NOT DETECTED Final   Streptococcus pyogenes NOT DETECTED NOT DETECTED Final   Acinetobacter baumannii NOT DETECTED NOT DETECTED Final   Enterobacteriaceae species NOT DETECTED NOT DETECTED Final   Enterobacter cloacae complex NOT DETECTED NOT DETECTED Final    Escherichia coli NOT DETECTED NOT DETECTED Final   Klebsiella oxytoca NOT DETECTED NOT DETECTED Final   Klebsiella pneumoniae NOT DETECTED NOT DETECTED Final   Proteus species NOT DETECTED NOT DETECTED Final   Serratia marcescens NOT DETECTED NOT DETECTED Final   Haemophilus influenzae NOT DETECTED NOT DETECTED Final   Neisseria meningitidis NOT DETECTED NOT DETECTED Final   Pseudomonas aeruginosa NOT DETECTED NOT DETECTED Final   Candida albicans NOT DETECTED NOT DETECTED Final   Candida glabrata NOT DETECTED NOT DETECTED Final   Candida krusei NOT DETECTED NOT DETECTED Final   Candida parapsilosis NOT DETECTED NOT DETECTED Final   Candida tropicalis NOT DETECTED NOT DETECTED Final    Comment: Performed at [facility], [place] [address] [place], [place] [address]  Culture, blood (routine x 2)     Status: None (Preliminary result)   Collection Time: 01/23/17 10:48 AM  Result Value Ref Range Status   Specimen Description BLOOD RIGHT ANTECUBITAL  Final   Special Requests IN PEDIATRIC BOTTLE Blood Culture adequate volume  Final   Culture   Final    NO GROWTH < 24 HOURS Performed at [facility], [place] [address], [place], [place] [address]    Report Status PENDING  Incomplete  Culture, blood (routine x 2)     Status: None (Preliminary result)   Collection Time: 01/23/17 10:48 AM  Result Value Ref Range Status   Specimen Description BLOOD RIGHT HAND  Final   Special Requests IN PEDIATRIC BOTTLE Blood Culture adequate volume  Final   Culture   Final    NO GROWTH < 24 HOURS Performed at [facility], [place] [address] [place],  [address]    Report Status PENDING  Incomplete     Time coordinating discharge: 45 minutes  SIGNED:   Tawni Millers, MD  [facility] 01/25/2017, 11:24 AM Pager   If 7PM-7AM, please contact night-coverage [email or web address] Password [email or web address]

## 2017-01-27 ENCOUNTER — Non-Acute Institutional Stay (SKILLED_NURSING_FACILITY): Payer: Medicare Other | Admitting: Internal Medicine

## 2017-01-27 ENCOUNTER — Encounter: Payer: Self-pay | Admitting: Internal Medicine

## 2017-01-27 DIAGNOSIS — M869 Osteomyelitis, unspecified: Secondary | ICD-10-CM | POA: Diagnosis not present

## 2017-01-27 DIAGNOSIS — E878 Other disorders of electrolyte and fluid balance, not elsewhere classified: Secondary | ICD-10-CM

## 2017-01-27 DIAGNOSIS — I5022 Chronic systolic (congestive) heart failure: Secondary | ICD-10-CM | POA: Diagnosis not present

## 2017-01-27 DIAGNOSIS — G9341 Metabolic encephalopathy: Secondary | ICD-10-CM | POA: Diagnosis not present

## 2017-01-27 DIAGNOSIS — E870 Hyperosmolality and hypernatremia: Secondary | ICD-10-CM | POA: Diagnosis not present

## 2017-01-27 DIAGNOSIS — L97524 Non-pressure chronic ulcer of other part of left foot with necrosis of bone: Secondary | ICD-10-CM

## 2017-01-27 DIAGNOSIS — M6282 Rhabdomyolysis: Secondary | ICD-10-CM

## 2017-01-27 DIAGNOSIS — M1991 Primary osteoarthritis, unspecified site: Secondary | ICD-10-CM

## 2017-01-27 DIAGNOSIS — N170 Acute kidney failure with tubular necrosis: Secondary | ICD-10-CM

## 2017-01-27 DIAGNOSIS — I110 Hypertensive heart disease with heart failure: Secondary | ICD-10-CM | POA: Diagnosis not present

## 2017-01-27 DIAGNOSIS — N183 Chronic kidney disease, stage 3 (moderate): Secondary | ICD-10-CM

## 2017-01-27 DIAGNOSIS — A4101 Sepsis due to Methicillin susceptible Staphylococcus aureus: Secondary | ICD-10-CM | POA: Diagnosis not present

## 2017-01-27 DIAGNOSIS — R7881 Bacteremia: Secondary | ICD-10-CM | POA: Diagnosis not present

## 2017-01-27 LAB — CBC AND DIFFERENTIAL
HCT: 43 (ref 41–53)
HEMOGLOBIN: 14.6 (ref 13.5–17.5)
Platelets: 138 — AB (ref 150–399)
WBC: 6.8

## 2017-01-27 LAB — BASIC METABOLIC PANEL
BUN: 48 — AB (ref 4–21)
Creatinine: 3.8 — AB (ref 0.6–1.3)
Glucose: 77
POTASSIUM: 4.8 (ref 3.4–5.3)
Sodium: 148 — AB (ref 137–147)

## 2017-01-27 NOTE — Progress Notes (Signed)
: Provider:  Noah Delaine. [name], MD Location:  [place] Room Number: 932I Place of Service:  SNF [phone])  PCP: Tisovec, Fransico Him, MD Patient Care Team: Tisovec, Fransico Him, MD as PCP - General (Internal Medicine)  Extended Emergency Contact Information Primary Emergency Contact: Charlton Amor of [place] Phone: [phone] Work Phone: [phone] Mobile Phone: [phone] Relation: Daughter     Allergies: Patient has no known allergies.  Chief Complaint  Patient presents with  . New Admit To SNF    Admit to Facility    HPI: Patient is 83 y.o. male with HTN and asthma who presented to ED with AMS.Has developed progressive confusion and weakness, found on the floor by his family. On the initial examination he was lethargic, blood pressure 161/88, RR 20, HR 95 with oxygen saturation 96%. Oral mucosa moist, lungs clear to auscultation, heart tachycardic, abdomen soft and non tender, lower extremities with left foot, 1st toe with purulent ulcerated lesion, plus other multiple wounds. Left foot x ray with acute osteomyelitis on the first and second toes.Sodium 150, potassium 4.8, chloride 107, bicarbonate 26, glucose 143, BUN 67, creatinine 2.17, white count 14.8, hemolyzed 17.8, hematocrit 54.2, platelets 291, CPK 2,166, urine with too numerous to count white cells, CT head with mild diffuse cortical atrophy. EKG normal sinus rhythm, positive premature ventricular complexes. Pt was admitted to [facility] from 6/4-9 with sepsis due to left foot cellulitis with MSSA osteomyelitis, complicated by dehydration, rhabdomyolysis, hypernatremia, acute kidney injury, and acute metabolic encephalopathy. Pt is admitted to SNF for one moth of IV antibiotics and OT/PT. While at [facility] pt will be followed for HTN, treated with no meds at the moment will monitor, chronic systolic CHF, will monitor and start bblocker if possible, and and arthritis, tx with  tylenol.  Past Medical History:  Diagnosis Date  . Arthritis   . Asthma   . Diverticulosis of colon   . Dizziness   . HTN (hypertension)   . PAD (peripheral artery disease) (HCC)    severe bilateral lower ext disease. Runoff 1/12 per Cr. Chen with severe disease R SFA and below the knee    Past Surgical History:  Procedure Laterality Date  . APPENDECTOMY    . exploration debridement     repair of Achilles Tendon repture, right  . perforated large intestines    . right carotic endarentomy  2005  . TONSILLECTOMY      Allergies as of 01/27/2017   No Known Allergies     Medication List       Accurate as of 01/27/17 10:36 AM. Always use your most recent med list.          acetaminophen 325 MG tablet Commonly known as:  TYLENOL Take 2 tablets (650 mg total) by mouth every 6 (six) hours as needed for mild pain (or Fever >/= 101).   Ampicillin-Sulbactam 3 g in sodium chloride 0.9 % 100 mL Inject 3 g into the vein every 6 (six) hours. Last dose 03/04/2017 Please remove PICC line after last dose on 03/04/2017 Weekly BMP, ESR, CRP.   bisacodyl 10 MG suppository Commonly known as:  DULCOLAX Place 1 suppository (10 mg total) rectally daily as needed for mild constipation or moderate constipation.   feeding supplement (ENSURE ENLIVE) Liqd Take 237 mLs by mouth 2 (two) times daily between meals.   senna 8.6 MG Tabs tablet Commonly known as:  SENOKOT Take 1 tablet (8.6 mg total) by mouth at  bedtime.       No orders of the defined types were placed in this encounter.    There is no immunization history on file for this patient.  Social History  Substance Use Topics  . Smoking status: Former Research scientist (life sciences)  . Smokeless tobacco: Never Used     Comment: quit 30 years ago  . Alcohol use No    Family history is   Family History  Problem Relation Age of Onset  . Liver disease Brother       Review of Systems  DATA OBTAINED: from patient- limited contribution; nurse   GENERAL:  no fevers, fatigue, appetite changes SKIN: No itching, or rash EYES: No eye pain, redness, discharge EARS: No earache, tinnitus, change in hearing NOSE: No congestion, drainage or bleeding  MOUTH/THROAT: No mouth or tooth pain, No sore throat RESPIRATORY: No cough, wheezing, SOB CARDIAC: No chest pain, palpitations, lower extremity edema  GI: No abdominal pain, No N/V/D or constipation, No heartburn or reflux  GU: No dysuria, frequency or urgency, or incontinence  MUSCULOSKELETAL: No unrelieved bone/joint pain NEUROLOGIC: No headache, dizziness or focal weakness PSYCHIATRIC: No c/o anxiety or sadness but wants to go home  Vitals:   01/27/17 1022  BP: (!) 159/78  Pulse: 79  Resp: 18  Temp: 98 F (36.7 C)    SpO2 Readings from Last 1 Encounters:  01/27/17 95%   Body mass index is 25.09 kg/m.     Physical Exam  GENERAL APPEARANCE: Alert, conversant,  No acute distress.  SKIN: No diaphoresis rash HEAD: Normocephalic, atraumatic  EYES: Conjunctiva/lids clear. Pupils round, reactive. EOMs intact.  EARS: External exam WNL, canals clear. Hearing grossly normal.  NOSE: No deformity or discharge.  MOUTH/THROAT: Lips w/o lesions  RESPIRATORY: Breathing is even, unlabored. Lung sounds are clear   CARDIOVASCULAR: Heart RRR no murmurs, rubs or gallops. No peripheral edema.   GASTROINTESTINAL: Abdomen is soft, non-tender, not distended w/ normal bowel sounds. GENITOURINARY: Bladder non tender, not distended  MUSCULOSKELETAL: dressed L great and second toe NEUROLOGIC:  Cranial nerves 2-12 grossly intact. Moves all extremities  PSYCHIATRIC: Mood and affect with dementia, no behavioral issues  Patient Active Problem List   Diagnosis Date Noted  . Goals of care, counseling/discussion   . Palliative care by specialist   . Protein-calorie malnutrition, severe 01/22/2017  . Osteomyelitis of toe of left foot ([place]) 01/22/2017  . Staphylococcus aureus bacteremia 01/22/2017   . Memory loss 01/22/2017  . Dehydration, severe   . Moderate protein-calorie malnutrition ([place])   . Sepsis ([place]) 01/20/2017  . Foot ulcer ([place]) 01/20/2017  . Hypernatremia 01/20/2017  . Rhabdomyolysis 01/20/2017  . AKI (acute kidney injury) ([place]) 01/20/2017  . TOBACCO ABUSE 09/21/2010  . CELLULITIS AND ABSCESS OF LEG EXCEPT FOOT 09/11/2010  . PVD 07/04/2010  . HYPERCHOLESTEROLEMIA 07/03/2010  . Essential hypertension 07/03/2010  . ASTHMA 07/03/2010  . COPD (chronic obstructive pulmonary disease) ([place]) 07/03/2010  . DIVERTICULOSIS, COLON, HX OF 07/03/2010  . ATHEROSLERO NATIVE ART EXTREMITIES W/ULCERATION 06/22/2010      Labs reviewed: Basic Metabolic Panel:    Component Value Date/Time   NA 148 (H) 01/24/2017 0633   NA 148 (A) 01/24/2017   K 4.2 01/24/2017 0633   CL 112 (H) 01/24/2017 0633   CO2 26 01/24/2017 0633   GLUCOSE 166 (H) 01/24/2017 0633   BUN 28 (H) 01/24/2017 0633   BUN 28 (A) 01/24/2017   CREATININE 1.25 (H) 01/24/2017 0633   CALCIUM 8.7 (L) 01/24/2017 1610  PROT 7.7 01/20/2017 1119   ALBUMIN 2.9 (L) 01/20/2017 1119   AST 92 (H) 01/20/2017 1119   ALT 35 01/20/2017 1119   ALKPHOS 79 01/20/2017 1119   BILITOT 1.0 01/20/2017 1119   GFRNONAA 49 (L) 01/24/2017 0633   GFRAA 57 (L) 01/24/2017 3329     Recent Labs  01/22/17 1002 01/23/17 01/23/17 0657 01/24/17 01/24/17 0633  NA 152* 147 147* 148* 148*  K 3.3* 4.1 4.1  --  4.2  CL 117*  --  113*  --  112*  CO2 28  --  30  --  26  GLUCOSE 107*  --  125*  --  166*  BUN 40* 32* 32* 28* 28*  CREATININE 1.35* 1.2 1.16 1.3 1.25*  CALCIUM 8.4*  --  8.2*  --  8.7*   Liver Function Tests:  Recent Labs  01/20/17 1119  AST 92*  ALT 35  ALKPHOS 79  BILITOT 1.0  PROT 7.7  ALBUMIN 2.9*   No results for input(s): LIPASE, AMYLASE in the last 8760 hours. No results for input(s): AMMONIA in the last 8760 hours. CBC:  Recent Labs  01/22/17 1002 01/23/17 01/23/17 0657 01/24/17 01/24/17 0633  WBC 9.9  8.0 8.0 11.5 11.5*  NEUTROABS 8.5*  --  6.4  --  10.6*  HGB 14.3 13.3* 13.3  --  14.8  HCT 45.2 42 42.3  --  45.7  MCV 94.4  --  94.2  --  92.7  PLT 227 199 199  --  199   Lipid No results for input(s): CHOL, HDL, LDLCALC, TRIG in the last 8760 hours.  Cardiac Enzymes:  Recent Labs  01/21/17 0633 01/22/17 0719 01/23/17 0657  CKTOTAL 1,053* 436* 226   BNP: No results for input(s): BNP in the last 8760 hours. No results found for: [facility] Lab Results  Component Value Date   HGBA1C 5.5 01/20/2017   Lab Results  Component Value Date   TSH 1.234 01/21/2017   Lab Results  Component Value Date   VITAMINB12 2,055 (H) 01/21/2017   No results found for: FOLATE No results found for: IRON, TIBC, FERRITIN  Imaging and Procedures obtained prior to SNF admission: Ct Head Wo Contrast  Result Date: 01/20/2017 CLINICAL DATA:  Unwitnessed fall last night. EXAM: CT HEAD WITHOUT CONTRAST TECHNIQUE: Contiguous axial images were obtained from the base of the skull through the vertex without intravenous contrast. COMPARISON:  CT scan of September 22, 2015. FINDINGS: Brain: Mild diffuse cortical atrophy is noted No mass effect or midline shift is noted. Ventricular size is within normal limits. There is no evidence of mass lesion, hemorrhage or acute infarction. Vascular: Atherosclerosis of carotid siphons is noted. Skull: Normal. Negative for fracture or focal lesion. Sinuses/Orbits: No acute finding. Other: None. IMPRESSION: Mild diffuse cortical atrophy. No acute intracranial abnormality seen. Electronically Signed   By: Marijo Conception, M.D.   On: 01/20/2017 12:17   Dg Foot Complete Left  Result Date: 01/20/2017 CLINICAL DATA:  Left great toe ulcer. No known injury. Known peripheral vascular disease, former smoker. EXAM: LEFT FOOT - COMPLETE 3+ VIEW COMPARISON:  None in PACs FINDINGS: There is bony resorption of the entire distal phalanx of the second toe. There is partial resorption of the  distal phalanx of the great toe as well as lateral subluxation of the base of the distal phalanx with respect to the head of the proximal phalanx of the great toe. The other phalanges appear intact. There is chronic bony resorption of  the head of the second metatarsal and a portion of the base of the proximal phalanx of the second toe. There are vascular calcifications. There are plantar and Achilles region calcaneal spurs. No tarsal bone abnormalities are observed. IMPRESSION: Findings worrisome for osteomyelitis of the great toe and second toe distally. Probable chronic arthropathic change of the second MTP joint. Electronically Signed   By: David  Martinique M.D.   On: 01/20/2017 15:33   Dg Hip Unilat With Pelvis 2-3 Views Left  Result Date: 01/21/2017 CLINICAL DATA:  Fall with left hip pain. EXAM: DG HIP (WITH OR WITHOUT PELVIS) 2-3V LEFT COMPARISON:  None. FINDINGS: No acute fracture or dislocation identified. There is osteoarthritis of the hip joint. Atherosclerotic calcifications are seen involving the femoral arteries. No bony lesions or destruction. IMPRESSION: No evidence of acute hip fracture. Osteoarthritis present of the left hip joint. Femoral atherosclerosis. Electronically Signed   By: Aletta Edouard M.D.   On: 01/21/2017 16:17     Not all labs, radiology exams or other studies done during hospitalization come through on my EPIC note; however they are reviewed by me.    Assessment and Plan  SEPSIS/l FOOT OSTEO/ MSSA BACTEREMIA - placed on broad spectrum antibiotics including vancomycin, ceftriaxone and metronidazole. Urine culture and blood cultures grew Staphylococcus aureus (MSSA). Patient was seen by infectious disease with recommendation to continue IV ampicillin/sulbactam. Follow-up blood cultures from June 7 were no growth, echocardiography, transthoracic, showed a left ventricle ejection fraction of 40-45% with diffuse hypokinesis, no evidence of vegetation. Patient was seen by  palliative care team, patient was adamant about not getting amputation, decision was made to complete antibiotic therapy  SNF - IV antibiotics at the skilled nursing facilitywith Unas[facility] to finish on 03/04/2017.Need to follow weekly ESR,CRP and BMP; pt will be doing OT/PT  METABOLIC ENCEPHALOPATHY - pt had episodes of confusion and agitation requiring haldol and benzos; resolved by time of d/c  ACUTE ON CKD3/ HYPERNATREMIA/HYPERCHLOREMIA//RHABDOMYOLYSIS - improved with hypotonic IVF; baseline Cr is around 1.3, GFR 42. At d/c Na 148, Cl 112, BUN 28, Cr 1.25 SNF - follow up BMP weekly while on IV antibiotics  HTN/ CHRONIC SYSTOLIC CHF SNF - initial reading BP is not wll controlled and the CHD would benefit from a BBlocker; will monitor BP several more days and if still elevated will start low dose metoprolol.  OA - diffuse . Age related SNF - pt was using advil;, will be using tylenol    Time spent  >60mn; > 50% of time with patient was spent reviewing records, labs, tests and studies, counseling and developing plan of care  ANoah Delaine ASheppard Coil MD

## 2017-01-28 ENCOUNTER — Encounter: Payer: Self-pay | Admitting: Internal Medicine

## 2017-01-28 DIAGNOSIS — I5022 Chronic systolic (congestive) heart failure: Secondary | ICD-10-CM | POA: Insufficient documentation

## 2017-01-28 DIAGNOSIS — N183 Chronic kidney disease, stage 3 (moderate): Secondary | ICD-10-CM

## 2017-01-28 DIAGNOSIS — E878 Other disorders of electrolyte and fluid balance, not elsewhere classified: Secondary | ICD-10-CM | POA: Insufficient documentation

## 2017-01-28 DIAGNOSIS — G9341 Metabolic encephalopathy: Secondary | ICD-10-CM | POA: Insufficient documentation

## 2017-01-28 DIAGNOSIS — M1990 Unspecified osteoarthritis, unspecified site: Secondary | ICD-10-CM | POA: Insufficient documentation

## 2017-01-28 DIAGNOSIS — N179 Acute kidney failure, unspecified: Secondary | ICD-10-CM | POA: Insufficient documentation

## 2017-01-28 DIAGNOSIS — I110 Hypertensive heart disease with heart failure: Secondary | ICD-10-CM | POA: Insufficient documentation

## 2017-01-28 LAB — BASIC METABOLIC PANEL
BUN: 46 — AB (ref 4–21)
Creatinine: 3.5 — AB (ref 0.6–1.3)
Glucose: 84
Potassium: 5.2 (ref 3.4–5.3)
SODIUM: 149 — AB (ref 137–147)

## 2017-01-28 LAB — CULTURE, BLOOD (ROUTINE X 2)
CULTURE: NO GROWTH
Culture: NO GROWTH
SPECIAL REQUESTS: ADEQUATE
Special Requests: ADEQUATE

## 2017-01-28 NOTE — Progress Notes (Signed)
Location:  [place] Room Number: 326Z Place of Service:  SNF (31)  [name] [name]. [name] Coil, MD  Patient Care Team: Tisovec, Fransico Him, MD as PCP - General (Internal Medicine)  Extended Emergency Contact Information Primary Emergency Contact: Charlton Amor of [place] Phone: [phone] Work Phone: [phone] Mobile Phone: [phone] Relation: Daughter    Allergies: Patient has no known allergies.  Chief Complaint  Patient presents with  . Acute Visit    Acute     HPI: Patient is 83 y.o. male who   Past Medical History:  Diagnosis Date  . Arthritis   . Asthma   . Diverticulosis of colon   . Dizziness   . HTN (hypertension)   . PAD (peripheral artery disease) (HCC)    severe bilateral lower ext disease. Runoff 1/12 per Cr. Chen with severe disease R SFA and below the knee    Past Surgical History:  Procedure Laterality Date  . APPENDECTOMY    . exploration debridement     repair of Achilles Tendon repture, right  . perforated large intestines    . right carotic endarentomy  2005  . TONSILLECTOMY      Allergies as of 01/28/2017   No Known Allergies     Medication List       Accurate as of 01/28/17  1:54 PM. Always use your most recent med list.          acetaminophen 325 MG tablet Commonly known as:  TYLENOL Take 2 tablets (650 mg total) by mouth every 6 (six) hours as needed for mild pain (or Fever >/= 101).   Ampicillin-Sulbactam 3 g in sodium chloride 0.9 % 100 mL Inject 3 g into the vein every 6 (six) hours. Last dose 03/04/2017 Please remove PICC line after last dose on 03/04/2017 Weekly BMP, ESR, CRP.   bisacodyl 10 MG suppository Commonly known as:  DULCOLAX Place 1 suppository (10 mg total) rectally daily as needed for mild constipation or moderate constipation.   feeding supplement (ENSURE ENLIVE) Liqd Take 237 mLs by mouth 2 (two) times daily between meals.   feeding supplement  (PRO-STAT SUGAR FREE 64) Liqd Take 30 mLs by mouth 2 (two) times daily.   senna 8.6 MG Tabs tablet Commonly known as:  SENOKOT Take 1 tablet (8.6 mg total) by mouth at bedtime.       Meds ordered this encounter  Medications  . Amino Acids-Protein Hydrolys (FEEDING SUPPLEMENT, PRO-STAT SUGAR FREE 64,) LIQD    Sig: Take 30 mLs by mouth 2 (two) times daily.     There is no immunization history on file for this patient.  Social History  Substance Use Topics  . Smoking status: Former Research scientist (life sciences)  . Smokeless tobacco: Never Used     Comment: quit 30 years ago  . Alcohol use No    Review of Systems  DATA OBTAINED: from patient, nurse, medical record, family member GENERAL:  no fevers, fatigue, appetite changes SKIN: No itching, rash HEENT: No complaint RESPIRATORY: No cough, wheezing, SOB CARDIAC: No chest pain, palpitations, lower extremity edema  GI: No abdominal pain, No N/V/D or constipation, No heartburn or reflux  GU: No dysuria, frequency or urgency, or incontinence  MUSCULOSKELETAL: No unrelieved bone/joint pain NEUROLOGIC: No headache, dizziness  PSYCHIATRIC: No overt anxiety or sadness  Vitals:   01/28/17 1351  BP: 110/69  Pulse: (!) 59  Resp: 18  Temp: 97.6 F (36.4 C)   Body mass index is  20.34 kg/m. Physical Exam  GENERAL APPEARANCE: Alert, conversant, No acute distress  SKIN: No diaphoresis rash HEENT: Unremarkable RESPIRATORY: Breathing is even, unlabored. Lung sounds are clear   CARDIOVASCULAR: Heart RRR no murmurs, rubs or gallops. No peripheral edema  GASTROINTESTINAL: Abdomen is soft, non-tender, not distended w/ normal bowel sounds.  GENITOURINARY: Bladder non tender, not distended  MUSCULOSKELETAL: No abnormal joints or musculature NEUROLOGIC: Cranial nerves 2-12 grossly intact. Moves all extremities PSYCHIATRIC: Mood and affect appropriate to situation, no behavioral issues  Patient Active Problem List   Diagnosis Date Noted  . Goals of  care, counseling/discussion   . Palliative care by specialist   . Protein-calorie malnutrition, severe 01/22/2017  . Osteomyelitis of toe of left foot ([place]) 01/22/2017  . Staphylococcus aureus bacteremia 01/22/2017  . Memory loss 01/22/2017  . Dehydration, severe   . Moderate protein-calorie malnutrition ([place])   . Sepsis ([place]) 01/20/2017  . Foot ulcer ([place]) 01/20/2017  . Hypernatremia 01/20/2017  . Rhabdomyolysis 01/20/2017  . AKI (acute kidney injury) ([place]) 01/20/2017  . TOBACCO ABUSE 09/21/2010  . CELLULITIS AND ABSCESS OF LEG EXCEPT FOOT 09/11/2010  . PVD 07/04/2010  . HYPERCHOLESTEROLEMIA 07/03/2010  . Essential hypertension 07/03/2010  . ASTHMA 07/03/2010  . COPD (chronic obstructive pulmonary disease) ([place]) 07/03/2010  . DIVERTICULOSIS, COLON, HX OF 07/03/2010  . ATHEROSLERO NATIVE ART EXTREMITIES W/ULCERATION 06/22/2010    CMP     Component Value Date/Time   NA 148 (A) 01/27/2017   K 4.8 01/27/2017   CL 112 (H) 01/24/2017 0633   CO2 26 01/24/2017 0633   GLUCOSE 166 (H) 01/24/2017 0633   BUN 48 (A) 01/27/2017   CREATININE 3.8 (A) 01/27/2017   CREATININE 1.25 (H) 01/24/2017 0633   CALCIUM 8.7 (L) 01/24/2017 0633   PROT 7.7 01/20/2017 1119   ALBUMIN 2.9 (L) 01/20/2017 1119   AST 92 (H) 01/20/2017 1119   ALT 35 01/20/2017 1119   ALKPHOS 79 01/20/2017 1119   BILITOT 1.0 01/20/2017 1119   GFRNONAA 49 (L) 01/24/2017 0633   GFRAA 57 (L) 01/24/2017 1517    Recent Labs  01/22/17 1002  01/23/17 0657 01/24/17 01/24/17 0633 01/27/17  NA 152*  < > 147* 148* 148* 148*  K 3.3*  < > 4.1  --  4.2 4.8  CL 117*  --  113*  --  112*  --   CO2 28  --  30  --  26  --   GLUCOSE 107*  --  125*  --  166*  --   BUN 40*  < > 32* 28* 28* 48*  CREATININE 1.35*  < > 1.16 1.3 1.25* 3.8*  CALCIUM 8.4*  --  8.2*  --  8.7*  --   < > = values in this interval not displayed.  Recent Labs  01/20/17 1119  AST 92*  ALT 35  ALKPHOS 79  BILITOT 1.0  PROT 7.7  ALBUMIN 2.9*     Recent Labs  01/22/17 1002  01/23/17 0657 01/24/17 01/24/17 0633 01/27/17  WBC 9.9  < > 8.0 11.5 11.5* 6.8  NEUTROABS 8.5*  --  6.4  --  10.6*  --   HGB 14.3  < > 13.3  --  14.8 14.6  HCT 45.2  < > 42.3  --  45.7 43  MCV 94.4  --  94.2  --  92.7  --   PLT 227  < > 199  --  199 138*  < > = values in this interval not displayed.  No results for input(s): CHOL, LDLCALC, TRIG in the last 8760 hours.  Invalid input(s): HCL No results found for: [facility] Lab Results  Component Value Date   TSH 1.234 01/21/2017   Lab Results  Component Value Date   HGBA1C 5.5 01/20/2017   No results found for: CHOL, HDL, LDLCALC, LDLDIRECT, TRIG, CHOLHDL  Significant Diagnostic Results in last 30 days:  Ct Head Wo Contrast  Result Date: 01/20/2017 CLINICAL DATA:  Unwitnessed fall last night. EXAM: CT HEAD WITHOUT CONTRAST TECHNIQUE: Contiguous axial images were obtained from the base of the skull through the vertex without intravenous contrast. COMPARISON:  CT scan of September 22, 2015. FINDINGS: Brain: Mild diffuse cortical atrophy is noted No mass effect or midline shift is noted. Ventricular size is within normal limits. There is no evidence of mass lesion, hemorrhage or acute infarction. Vascular: Atherosclerosis of carotid siphons is noted. Skull: Normal. Negative for fracture or focal lesion. Sinuses/Orbits: No acute finding. Other: None. IMPRESSION: Mild diffuse cortical atrophy. No acute intracranial abnormality seen. Electronically Signed   By: Marijo Conception, M.D.   On: 01/20/2017 12:17   Dg Foot Complete Left  Result Date: 01/20/2017 CLINICAL DATA:  Left great toe ulcer. No known injury. Known peripheral vascular disease, former smoker. EXAM: LEFT FOOT - COMPLETE 3+ VIEW COMPARISON:  None in PACs FINDINGS: There is bony resorption of the entire distal phalanx of the second toe. There is partial resorption of the distal phalanx of the great toe as well as lateral subluxation of the base of  the distal phalanx with respect to the head of the proximal phalanx of the great toe. The other phalanges appear intact. There is chronic bony resorption of the head of the second metatarsal and a portion of the base of the proximal phalanx of the second toe. There are vascular calcifications. There are plantar and Achilles region calcaneal spurs. No tarsal bone abnormalities are observed. IMPRESSION: Findings worrisome for osteomyelitis of the great toe and second toe distally. Probable chronic arthropathic change of the second MTP joint. Electronically Signed   By: David  Martinique M.D.   On: 01/20/2017 15:33   Dg Hip Unilat With Pelvis 2-3 Views Left  Result Date: 01/21/2017 CLINICAL DATA:  Fall with left hip pain. EXAM: DG HIP (WITH OR WITHOUT PELVIS) 2-3V LEFT COMPARISON:  None. FINDINGS: No acute fracture or dislocation identified. There is osteoarthritis of the hip joint. Atherosclerotic calcifications are seen involving the femoral arteries. No bony lesions or destruction. IMPRESSION: No evidence of acute hip fracture. Osteoarthritis present of the left hip joint. Femoral atherosclerosis. Electronically Signed   By: Aletta Edouard M.D.   On: 01/21/2017 16:17    Assessment and Plan  No problem-specific Assessment & Plan notes found for this encounter.   Labs/tests ordered:    [name] [name]. [name] Coil, MD

## 2017-02-16 DEATH — deceased

## 2017-05-03 NOTE — Progress Notes (Signed)
This encounter was created in error - please disregard.

## 2018-10-20 IMAGING — CT CT HEAD W/O CM
3 of 4 series · 14 of 47 positions shown, 16 images · non-contrast
Comparison: CT scan of September 22, 2015.

CLINICAL DATA: Unwitnessed fall last night.

EXAM:
CT HEAD WITHOUT CONTRAST
TECHNIQUE: Contiguous axial images were obtained from the base of the skull
through the vertex without intravenous contrast.

[Series 5: coronal · coronal · 0.37mm/px · 3 of 70 slices shown]
[im 24/70  brain]
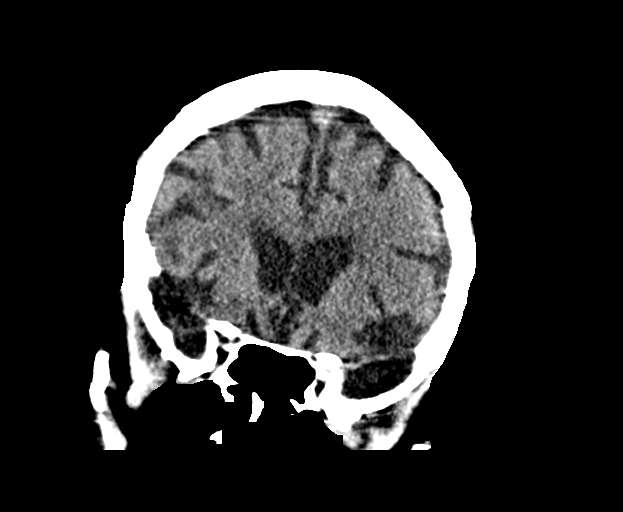
[im 31/70  brain]
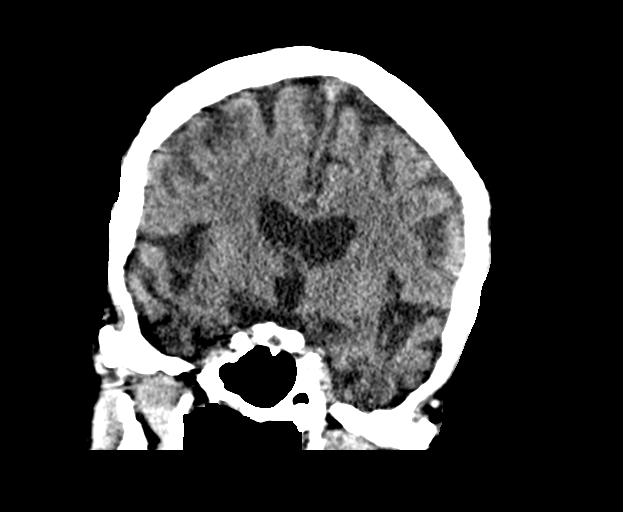
[im 39/70  brain]
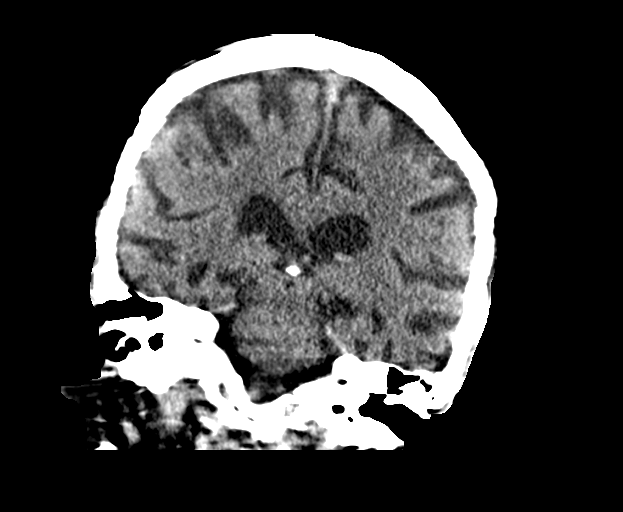

[Series 6: sagittal · sagittal · 0.34mm/px · 3 of 50 slices shown]
[im 20/50  brain]
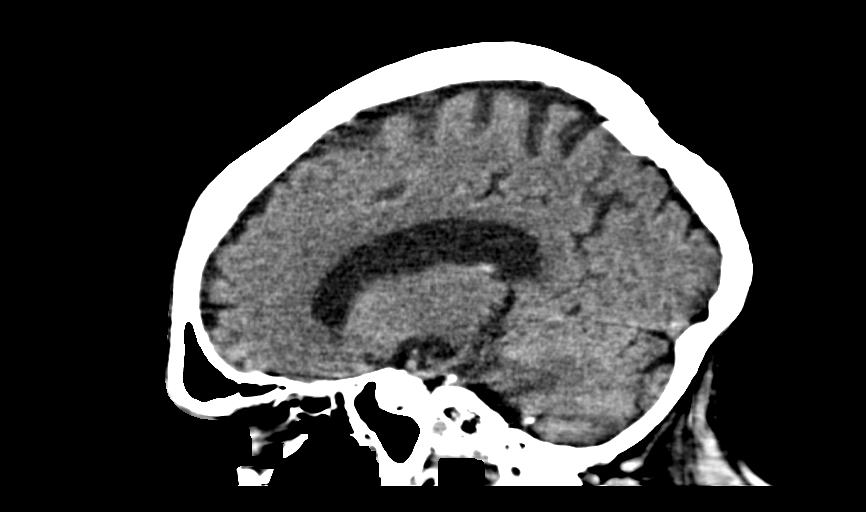
[im 25/50  brain]
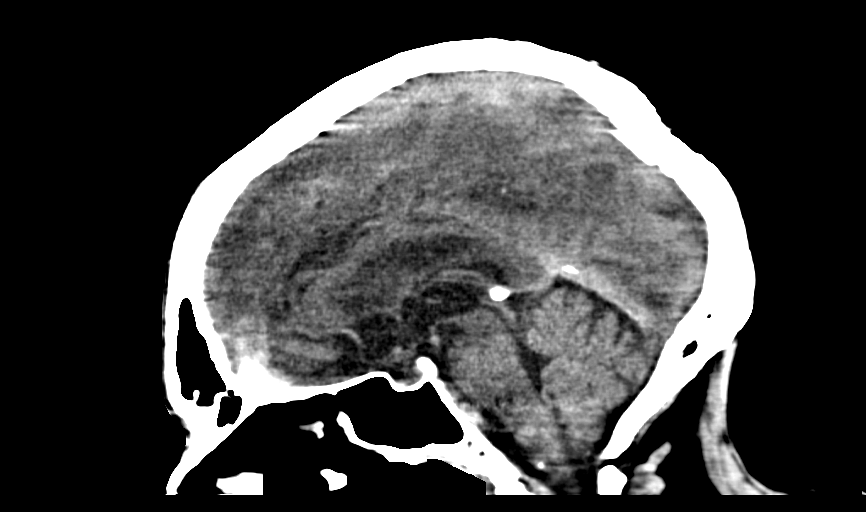
[im 30/50  brain]
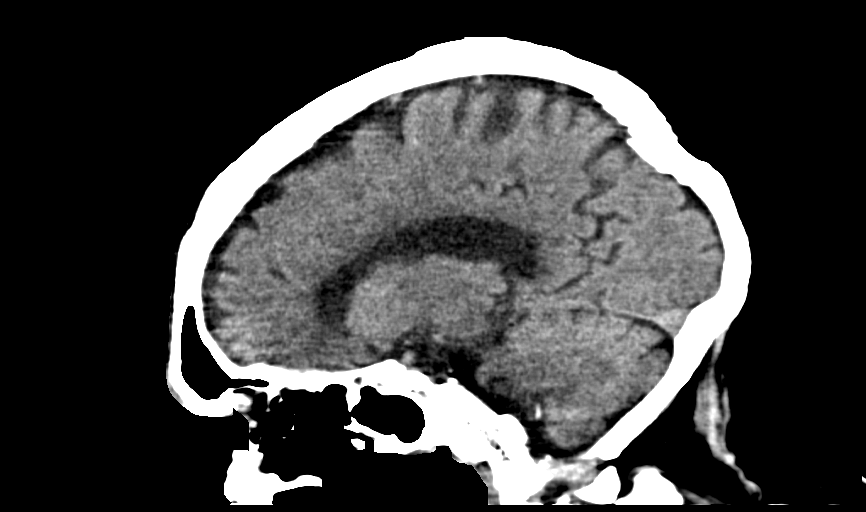

[Series 7: ang.axials · axial · 0.47mm/px · z∈[+1365,+1483]mm · 8 of 53 slices shown, 10 images]
[im 6/53  brain]
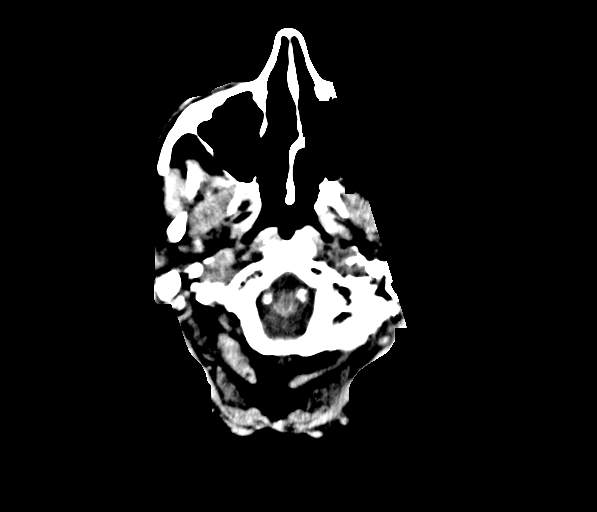
[im 6/53  bone]
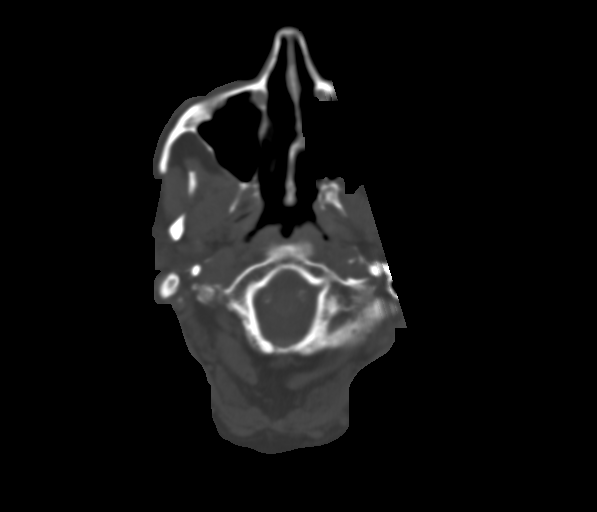
[im 12/53  brain]
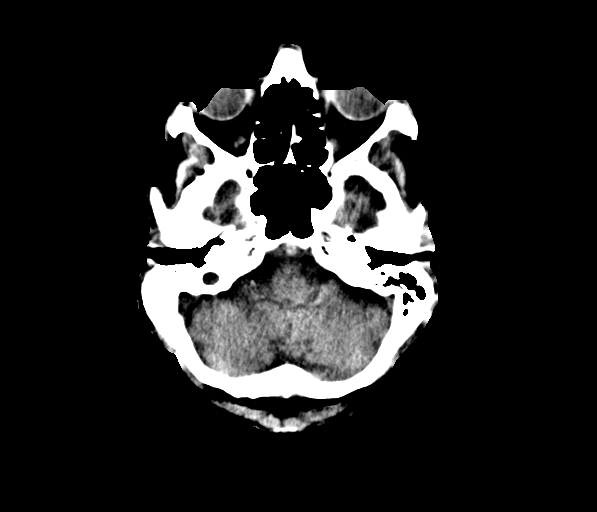
[im 18/53  brain]
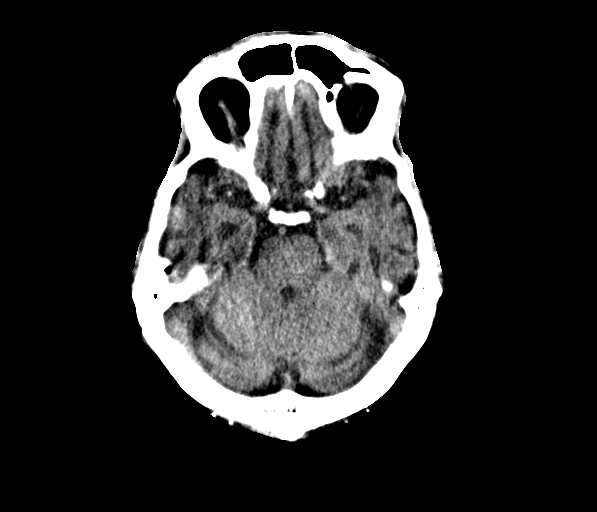
[im 24/53  brain]
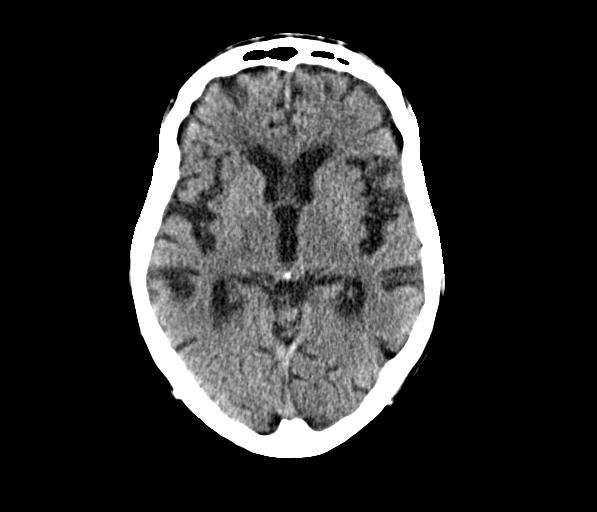
[im 29/53  brain]
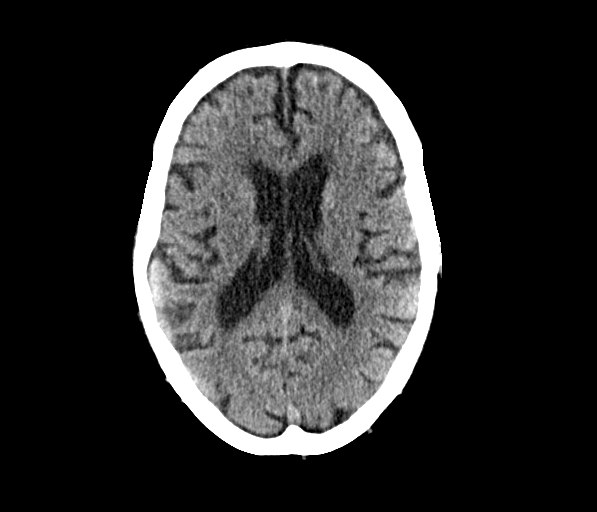
[im 29/53  bone]
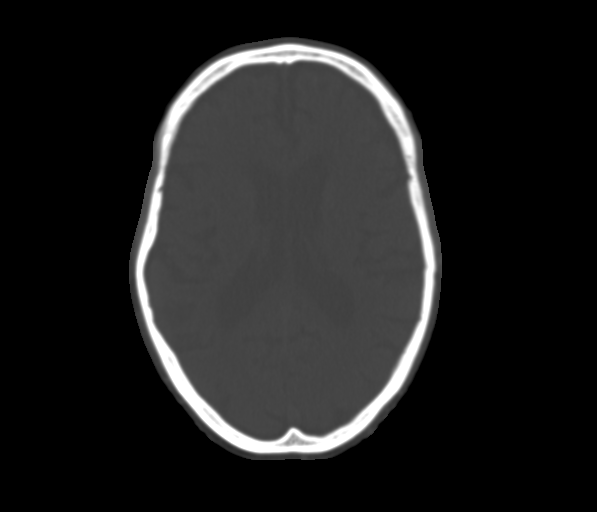
[im 35/53  brain]
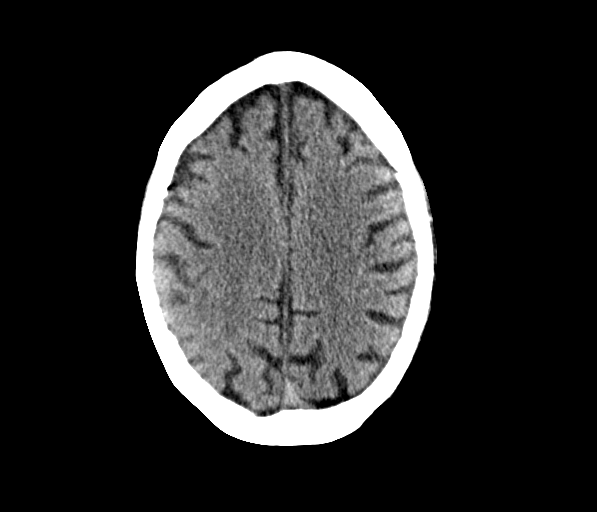
[im 41/53  brain]
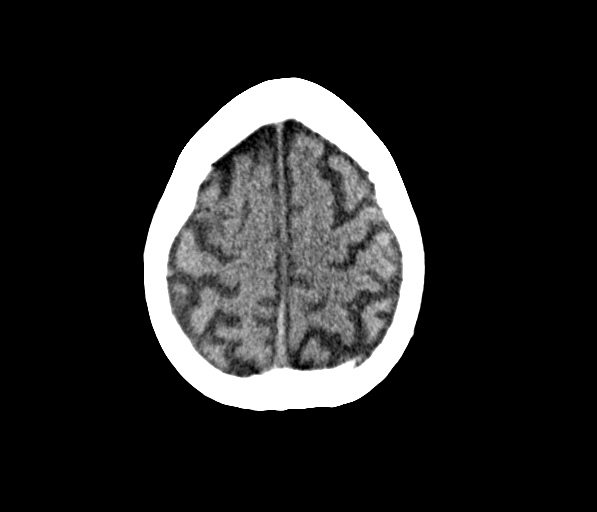
[im 47/53  brain]
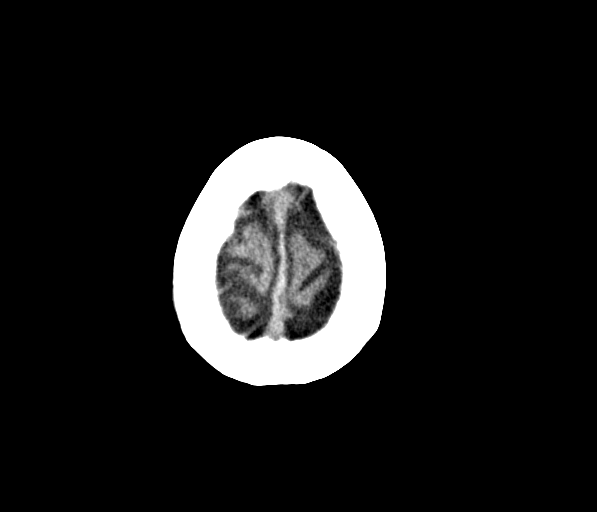

[14 of 47 positions shown; findings below may reference images not displayed]

FINDINGS: Brain: Mild diffuse cortical atrophy is noted No mass effect or
midline shift is noted. Ventricular size is within normal limits.
There is no evidence of mass lesion, hemorrhage or acute infarction.

Vascular: Atherosclerosis of carotid siphons is noted.

Skull: Normal. Negative for fracture or focal lesion.

Sinuses/Orbits: No acute finding.

Other: None.
IMPRESSION: Mild diffuse cortical atrophy. No acute intracranial abnormality
seen.

## 2018-10-20 IMAGING — CR DG FOOT COMPLETE 3+V*L*
3 series · 3 of 3 positions shown · non-contrast
Comparison: None in PACs

CLINICAL DATA: Left great toe ulcer. No known injury. Known
peripheral vascular disease, former smoker.

EXAM:
LEFT FOOT - COMPLETE 3+ VIEW

[x foot ap left]
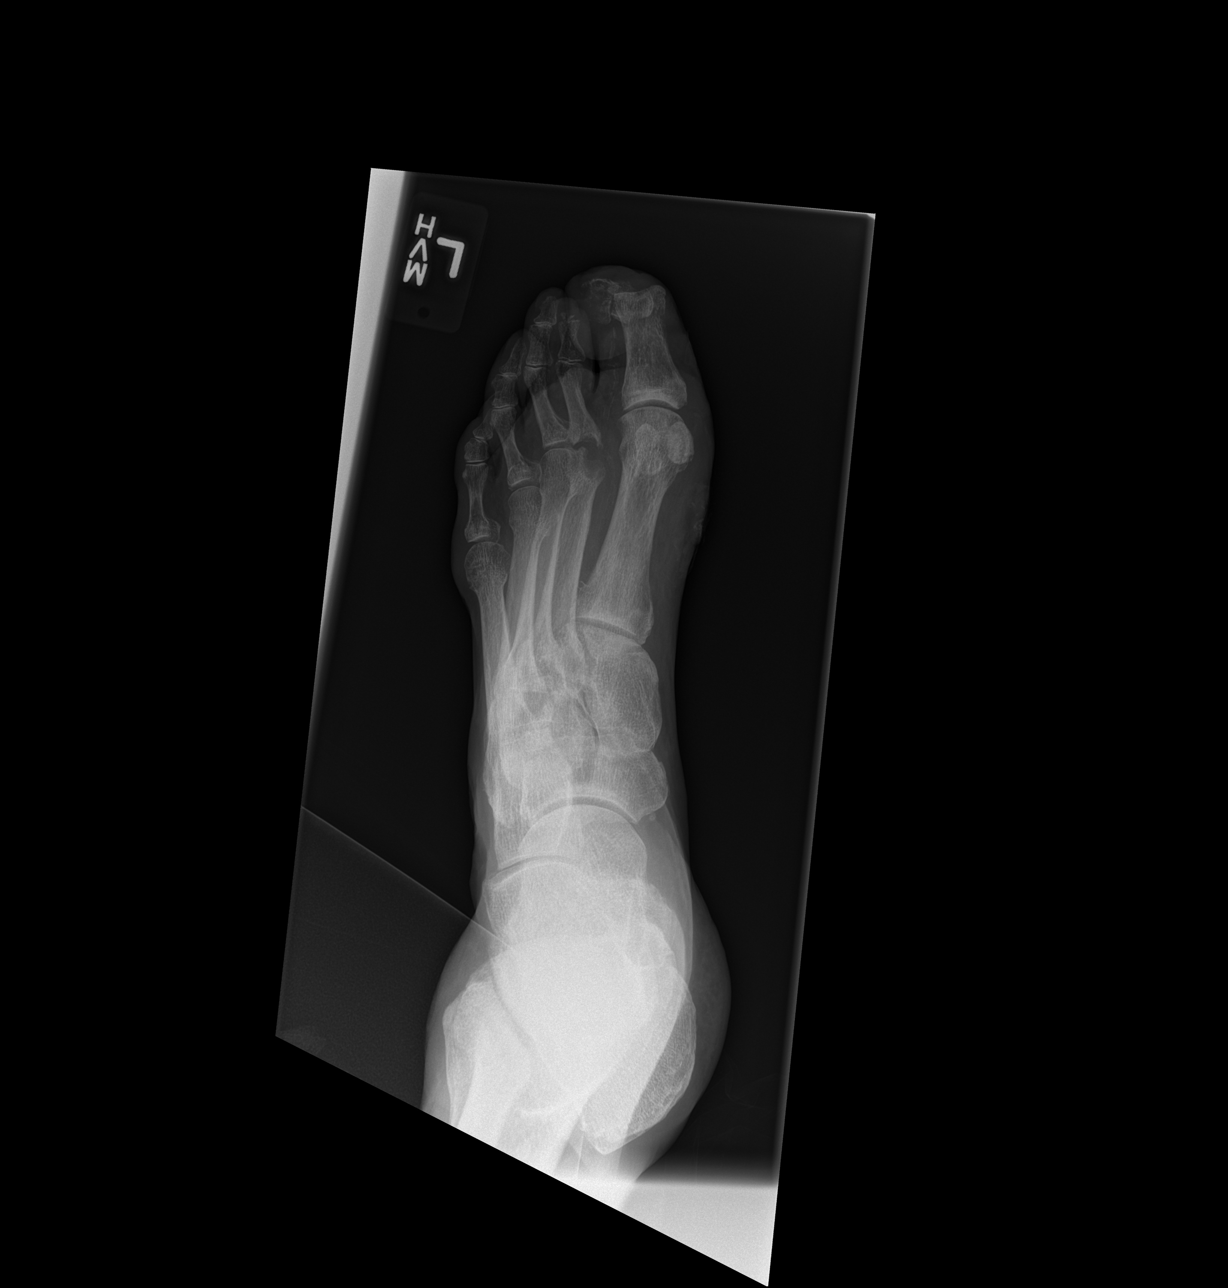

[x foot obl left]
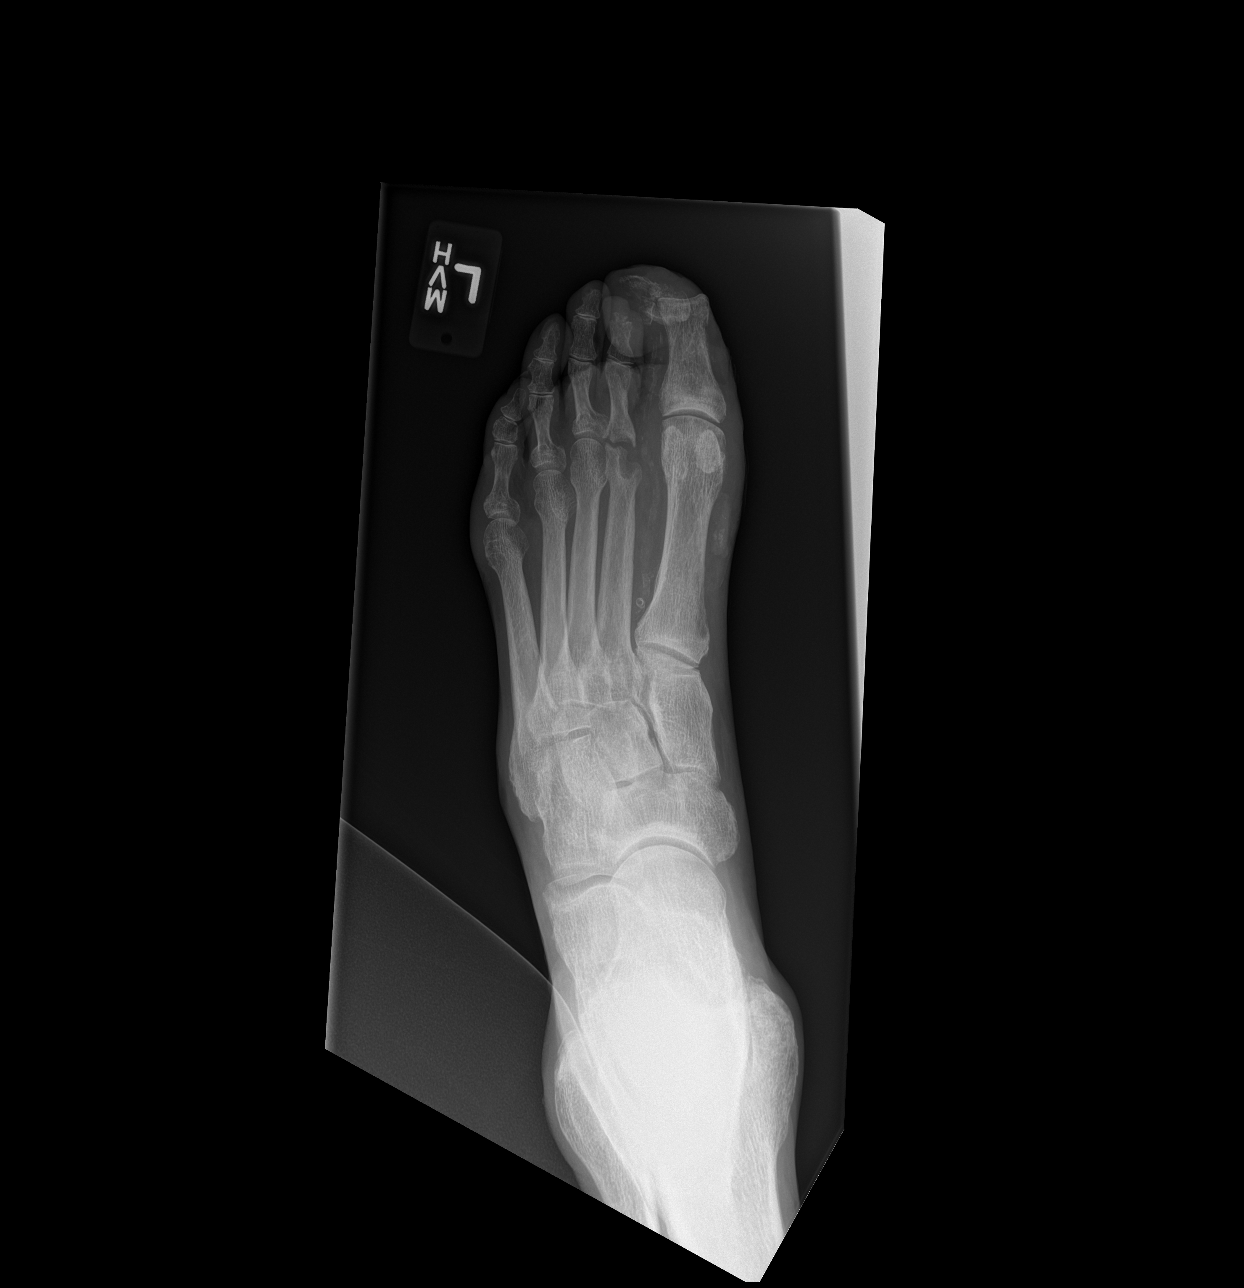

[x foot lat left]
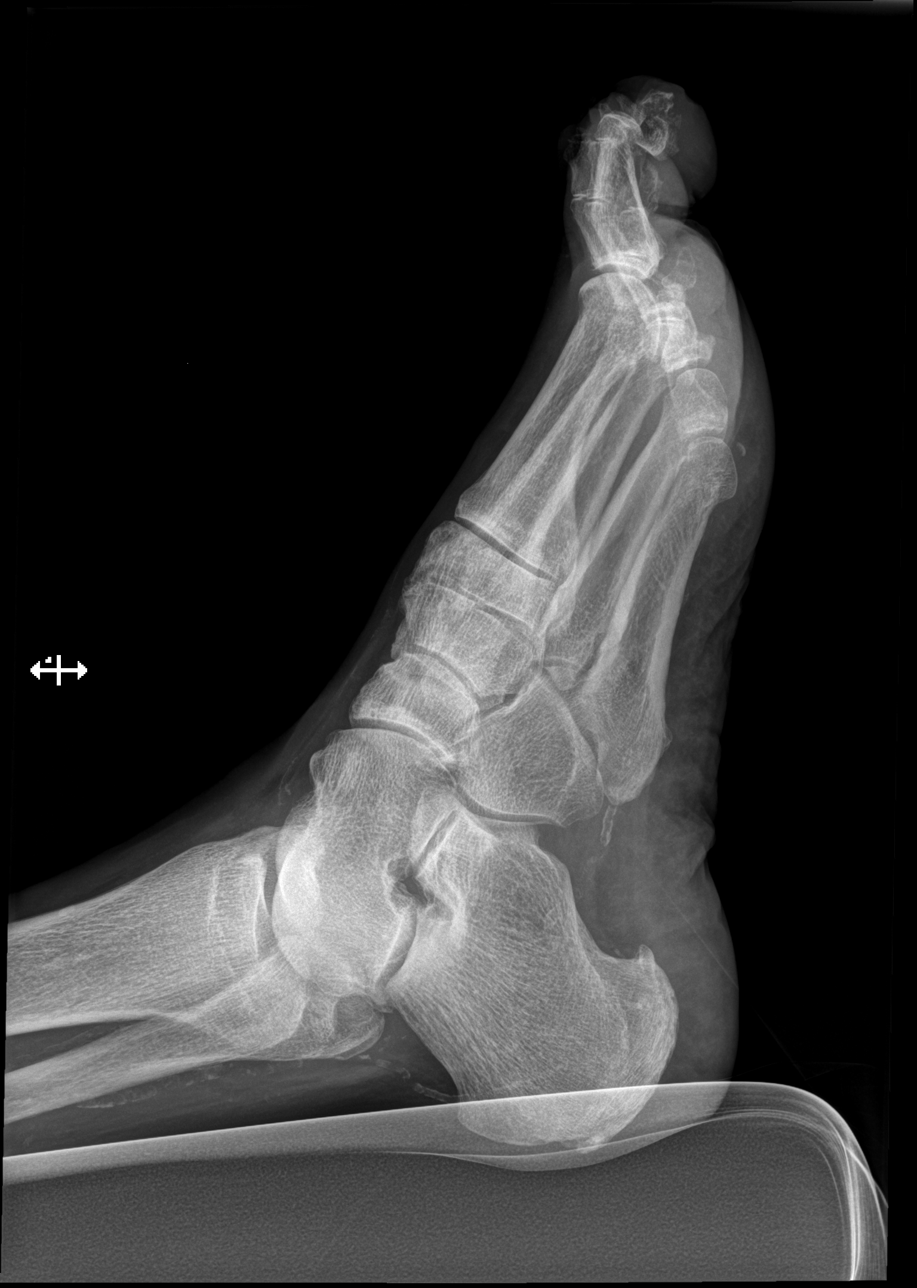

[3 of 3 positions shown; findings below may reference images not displayed]

FINDINGS: There is bony resorption of the entire distal phalanx of the second
toe. There is partial resorption of the distal phalanx of the great
toe as well as lateral subluxation of the base of the distal phalanx
with respect to the head of the proximal phalanx of the great toe.
The other phalanges appear intact. There is chronic bony resorption
of the head of the second metatarsal and a portion of the base of
the proximal phalanx of the second toe. There are vascular
calcifications. There are plantar and Achilles region calcaneal
spurs. No tarsal bone abnormalities are observed.
IMPRESSION: Findings worrisome for osteomyelitis of the great toe and second toe
distally. Probable chronic arthropathic change of the second MTP
joint.
